# Patient Record
Sex: Male | Born: 1970 | Race: White | Hispanic: No | Marital: Married | Smoking: Never smoker
Health system: Southern US, Community
[De-identification: ages and names within clinical notes are randomized; demographics above are authoritative.]

## PROBLEM LIST (undated history)

## (undated) DIAGNOSIS — N419 Inflammatory disease of prostate, unspecified: Secondary | ICD-10-CM

## (undated) DIAGNOSIS — T7840XA Allergy, unspecified, initial encounter: Secondary | ICD-10-CM

## (undated) HISTORY — PX: CHOLECYSTECTOMY: SHX55

## (undated) HISTORY — DX: Inflammatory disease of prostate, unspecified: N41.9

## (undated) HISTORY — PX: KNEE ARTHROSCOPY: SUR90

## (undated) HISTORY — DX: Allergy, unspecified, initial encounter: T78.40XA

## (undated) HISTORY — PX: BREAST SURGERY: SHX581

---

## 2004-04-20 ENCOUNTER — Ambulatory Visit: Payer: Self-pay | Admitting: Physician Assistant

## 2004-04-22 ENCOUNTER — Ambulatory Visit: Payer: Self-pay | Admitting: Unknown Physician Specialty

## 2004-05-07 ENCOUNTER — Ambulatory Visit: Payer: Self-pay | Admitting: Psychiatry

## 2004-06-18 ENCOUNTER — Ambulatory Visit: Payer: Self-pay | Admitting: Psychiatry

## 2004-08-06 ENCOUNTER — Ambulatory Visit: Payer: Self-pay | Admitting: Psychiatry

## 2005-06-28 ENCOUNTER — Emergency Department: Payer: Self-pay | Admitting: Emergency Medicine

## 2006-07-11 ENCOUNTER — Ambulatory Visit: Payer: Self-pay | Admitting: Family Medicine

## 2007-07-04 ENCOUNTER — Ambulatory Visit: Payer: Self-pay | Admitting: Family Medicine

## 2007-07-05 ENCOUNTER — Ambulatory Visit: Payer: Self-pay | Admitting: Family Medicine

## 2008-04-18 ENCOUNTER — Ambulatory Visit: Payer: Self-pay | Admitting: Family Medicine

## 2008-10-29 ENCOUNTER — Ambulatory Visit: Payer: Self-pay | Admitting: Sports Medicine

## 2009-11-14 ENCOUNTER — Emergency Department: Payer: Self-pay | Admitting: Emergency Medicine

## 2009-11-19 ENCOUNTER — Ambulatory Visit: Payer: Self-pay | Admitting: General Practice

## 2010-02-20 ENCOUNTER — Ambulatory Visit: Payer: Self-pay | Admitting: Internal Medicine

## 2012-03-15 ENCOUNTER — Ambulatory Visit: Payer: Self-pay | Admitting: Emergency Medicine

## 2012-03-15 LAB — RAPID INFLUENZA A&B ANTIGENS

## 2012-03-17 ENCOUNTER — Ambulatory Visit: Payer: Self-pay | Admitting: Family Medicine

## 2012-03-17 LAB — HEPATIC FUNCTION PANEL A (ARMC)
Albumin: 3.5 g/dL (ref 3.4–5.0)
Alkaline Phosphatase: 71 U/L (ref 50–136)
Bilirubin, Direct: 0.1 mg/dL (ref 0.00–0.20)
SGOT(AST): 24 U/L (ref 15–37)
Total Protein: 8.1 g/dL (ref 6.4–8.2)

## 2012-03-17 LAB — CBC WITH DIFFERENTIAL/PLATELET
Basophil #: 0.3 10*3/uL — ABNORMAL HIGH (ref 0.0–0.1)
Eosinophil %: 0.2 %
HCT: 45.1 % (ref 40.0–52.0)
HGB: 15.6 g/dL (ref 13.0–18.0)
Lymphocyte %: 14.5 %
MCV: 87 fL (ref 80–100)
Monocyte #: 0.4 x10 3/mm (ref 0.2–1.0)
Monocyte %: 8.9 %
Neutrophil #: 2.9 10*3/uL (ref 1.4–6.5)
Platelet: 163 10*3/uL (ref 150–440)
RBC: 5.21 10*6/uL (ref 4.40–5.90)
RDW: 13.2 % (ref 11.5–14.5)
WBC: 4.1 10*3/uL (ref 3.8–10.6)

## 2013-08-18 ENCOUNTER — Ambulatory Visit: Payer: Self-pay

## 2013-08-18 LAB — RAPID INFLUENZA A&B ANTIGENS

## 2013-08-18 LAB — RAPID STREP-A WITH REFLX: Micro Text Report: NEGATIVE

## 2013-08-20 ENCOUNTER — Ambulatory Visit: Payer: Self-pay

## 2013-08-20 LAB — CBC WITH DIFFERENTIAL/PLATELET
Basophil #: 0 10*3/uL (ref 0.0–0.1)
Basophil %: 0.3 %
EOS ABS: 0 10*3/uL (ref 0.0–0.7)
EOS PCT: 0 %
HCT: 45.7 % (ref 40.0–52.0)
HGB: 15.9 g/dL (ref 13.0–18.0)
LYMPHS ABS: 0.5 10*3/uL — AB (ref 1.0–3.6)
Lymphocyte %: 8.2 %
MCH: 30.2 pg (ref 26.0–34.0)
MCHC: 34.8 g/dL (ref 32.0–36.0)
MCV: 87 fL (ref 80–100)
MONOS PCT: 7.9 %
Monocyte #: 0.4 x10 3/mm (ref 0.2–1.0)
NEUTROS ABS: 4.6 10*3/uL (ref 1.4–6.5)
Neutrophil %: 83.6 %
PLATELETS: 147 10*3/uL — AB (ref 150–440)
RBC: 5.25 10*6/uL (ref 4.40–5.90)
RDW: 12.8 % (ref 11.5–14.5)
WBC: 5.5 10*3/uL (ref 3.8–10.6)

## 2013-08-20 LAB — URINALYSIS, COMPLETE
GLUCOSE, UR: NEGATIVE mg/dL (ref 0–75)
Ketone: NEGATIVE
LEUKOCYTE ESTERASE: NEGATIVE
NITRITE: NEGATIVE
Ph: 6.5 (ref 4.5–8.0)
Protein: 30
SPECIFIC GRAVITY: 1.025 (ref 1.003–1.030)
Squamous Epithelial: NONE SEEN

## 2013-08-20 LAB — BASIC METABOLIC PANEL
ANION GAP: 10 (ref 7–16)
BUN: 11 mg/dL (ref 7–18)
CHLORIDE: 90 mmol/L — AB (ref 98–107)
CO2: 27 mmol/L (ref 21–32)
Calcium, Total: 8.5 mg/dL (ref 8.5–10.1)
Creatinine: 1.06 mg/dL (ref 0.60–1.30)
EGFR (African American): >60
EGFR (Non-African Amer.): >60
Glucose: 212 mg/dL — ABNORMAL HIGH (ref 65–99)
OSMOLALITY: 261 (ref 275–301)
Potassium: 3.8 mmol/L (ref 3.5–5.1)
Sodium: 127 mmol/L — ABNORMAL LOW (ref 136–145)

## 2013-08-20 LAB — MONONUCLEOSIS SCREEN: Mono Test: POSITIVE

## 2013-08-20 LAB — RAPID STREP-A WITH REFLX: MICRO TEXT REPORT: NEGATIVE

## 2013-08-21 LAB — BETA STREP CULTURE(ARMC)

## 2013-08-22 LAB — URINE CULTURE

## 2013-08-23 LAB — BETA STREP CULTURE(ARMC)

## 2014-03-14 ENCOUNTER — Ambulatory Visit: Payer: Self-pay | Admitting: Internal Medicine

## 2015-03-30 ENCOUNTER — Ambulatory Visit
Admission: EM | Admit: 2015-03-30 | Discharge: 2015-03-30 | Disposition: A | Discharge status: Home / Self Care | Payer: BLUE CROSS/BLUE SHIELD | Attending: Family Medicine | Admitting: Family Medicine

## 2015-03-30 ENCOUNTER — Encounter: Payer: Self-pay | Admitting: Gynecology

## 2015-03-30 DIAGNOSIS — J069 Acute upper respiratory infection, unspecified: Secondary | ICD-10-CM

## 2015-03-30 LAB — RAPID STREP SCREEN (MED CTR MEBANE ONLY): Streptococcus, Group A Screen (Direct): NEGATIVE

## 2015-03-30 NOTE — ED Provider Notes (Signed)
Patient presents today with symptoms of nasal drainage, sore throat, cough for the last 3 days. He denies any colored mucus, fever, severe headache. He states his sore throat is typically when he is lying down. He denies any sick contacts. He has not been taking any medications for symptoms. Denies any chest pain, shortness breath, nausea, vomiting, diarrhea, abdominal pain. He denies any smoking history.  ROS: Negative except mentioned above.  Vitals as per Epic. GENERAL: NAD HEENT: mild pharyngeal erythema, no exudate, no erythema of TMs, no sinus tenderness, no cervical LAD RESP: CTA B CARD: RRR NEURO: CN II-XII grossly intact   A/P: URI- rapid strep test was negative, Tylenol/Motrin when necessary, Claritin when necessary, Delsym when necessary, rest, hydration, seek medical attention if symptoms persist or worsen as discussed.   Paulina Fusi, MD 03/30/15 [phone]

## 2015-03-30 NOTE — ED Notes (Signed)
Patient c/o x couple days postal nasal drip / head ace / coughing / sore throat and bilateral ear clogged.

## 2015-04-01 LAB — CULTURE, GROUP A STREP (THRC)

## 2016-01-09 ENCOUNTER — Encounter: Payer: Self-pay | Admitting: Family Medicine

## 2016-01-09 ENCOUNTER — Encounter (INDEPENDENT_AMBULATORY_CARE_PROVIDER_SITE_OTHER): Payer: Self-pay

## 2016-01-09 ENCOUNTER — Ambulatory Visit (INDEPENDENT_AMBULATORY_CARE_PROVIDER_SITE_OTHER): Payer: Managed Care, Other (non HMO) | Admitting: Family Medicine

## 2016-01-09 VITALS — BP 130/80 | HR 70 | Ht 70.0 in | Wt 228.0 lb

## 2016-01-09 DIAGNOSIS — E119 Type 2 diabetes mellitus without complications: Secondary | ICD-10-CM | POA: Diagnosis not present

## 2016-01-09 DIAGNOSIS — B356 Tinea cruris: Secondary | ICD-10-CM

## 2016-01-09 DIAGNOSIS — R351 Nocturia: Secondary | ICD-10-CM | POA: Diagnosis not present

## 2016-01-09 LAB — POCT CBG (FASTING - GLUCOSE)-MANUAL ENTRY: GLUCOSE FASTING, POC: 255 mg/dL — AB (ref 70–99)

## 2016-01-09 MED ORDER — NYSTATIN 100000 UNIT/GM EX CREA: 1.0000 "application " | TOPICAL_CREAM | Freq: Two times a day (BID) | CUTANEOUS | 0 refills | Status: DC

## 2016-01-09 MED ORDER — FLUCONAZOLE 150 MG PO TABS: 150.0000 mg | ORAL_TABLET | Freq: Once | ORAL | 1 refills | Status: AC

## 2016-01-09 MED ORDER — METFORMIN HCL 500 MG PO TABS: 500.0000 mg | ORAL_TABLET | Freq: Two times a day (BID) | ORAL | 1 refills | Status: DC

## 2016-01-09 NOTE — Patient Instructions (Signed)

## 2016-01-09 NOTE — Progress Notes (Signed)
Name: Marcus Baldwin   MRN: [id number]    DOB: Jan 01, 1971   Date:01/09/2016       Progress Note  Subjective  Chief Complaint  Chief Complaint  Patient presents with  . Nocturia    wants to discuss the concern of diabetes- dad is diabetic, but controls with diet. Pt has had to get up approx 3 times per night as well as an "irritation" in the groin area    Diabetes  He presents for his initial diabetic visit. His disease course has been stable. There are no hypoglycemic associated symptoms. Pertinent negatives for hypoglycemia include no dizziness, headaches or nervousness/anxiousness. Pertinent negatives for diabetes include no blurred vision, no chest pain, no fatigue, no foot paresthesias, no foot ulcerations, no polydipsia, no polyphagia, no polyuria, no visual change, no weakness and no weight loss. (Nocturia) There are no hypoglycemic complications. Symptoms are stable. There are no diabetic complications. He is following a generally healthy diet. His breakfast blood glucose is taken between 7-8 am. His breakfast blood glucose range is generally >200 mg/dl. An ACE inhibitor/angiotensin II receptor blocker is not being taken. He does not see a podiatrist.Eye exam is not current.    No problem-specific Assessment & Plan notes found for this encounter.   Past Medical History:  Diagnosis Date  . Allergy     Past Surgical History:  Procedure Laterality Date  . BREAST SURGERY Left    lumpectomy  . CHOLECYSTECTOMY    . KNEE ARTHROSCOPY Left     Family History  Problem Relation Age of Onset  . Diabetes Father   . Cancer Maternal Uncle     Social History   Social History  . Marital status: Married    Spouse name: N/A  . Number of children: N/A  . Years of education: N/A   Occupational History  . Not on file.   Social History Main Topics  . Smoking status: Never Smoker  . Smokeless tobacco: Never Used  . Alcohol use Yes  . Drug use: No  . Sexual activity: Yes    Other Topics Concern  . Not on file   Social History Narrative  . No narrative on file    No Known Allergies   Review of Systems  Constitutional: Negative for chills, fatigue, fever, malaise/fatigue and weight loss.  HENT: Negative for ear discharge, ear pain and sore throat.   Eyes: Negative for blurred vision.  Respiratory: Negative for cough, sputum production, shortness of breath and wheezing.   Cardiovascular: Negative for chest pain, palpitations and leg swelling.  Gastrointestinal: Negative for abdominal pain, blood in stool, constipation, diarrhea, heartburn, melena and nausea.  Genitourinary: Negative for dysuria, frequency, hematuria and urgency.  Musculoskeletal: Negative for back pain, joint pain, myalgias and neck pain.  Skin: Negative for rash.  Neurological: Negative for dizziness, tingling, sensory change, focal weakness, weakness and headaches.  Endo/Heme/Allergies: Negative for environmental allergies, polydipsia and polyphagia. Does not bruise/bleed easily.  Psychiatric/Behavioral: Negative for depression and suicidal ideas. The patient is not nervous/anxious and does not have insomnia.      Objective  Vitals:   01/09/16 1114  BP: 130/80  Pulse: 70  Weight: 228 lb (103.4 kg)  Height: 5\' 10"  (1.778 m)    Physical Exam  Constitutional: He is oriented to person, place, and time and well-developed, well-nourished, and in no distress.  HENT:  Head: Normocephalic.  Right Ear: External ear normal.  Left Ear: External ear normal.  Nose: Nose normal.  Mouth/Throat: Oropharynx  is clear and moist.  Eyes: Conjunctivae and EOM are normal. Pupils are equal, round, and reactive to light. Right eye exhibits no discharge. Left eye exhibits no discharge. No scleral icterus.  Neck: Normal range of motion. Neck supple. No JVD present. No tracheal deviation present. No thyromegaly present.  Cardiovascular: Normal rate, regular rhythm, normal heart sounds and intact  distal pulses.  Exam reveals no gallop and no friction rub.   No murmur heard. Pulmonary/Chest: Breath sounds normal. No respiratory distress. He has no wheezes. He has no rales.  Abdominal: Soft. Bowel sounds are normal. He exhibits no mass. There is no hepatosplenomegaly. There is no tenderness. There is no rebound, no guarding and no CVA tenderness.  Musculoskeletal: Normal range of motion. He exhibits no edema or tenderness.  Lymphadenopathy:    He has no cervical adenopathy.  Neurological: He is alert and oriented to person, place, and time. He has normal sensation, normal strength, normal reflexes and intact cranial nerves. No cranial nerve deficit.  Skin: Skin is warm. No rash noted.  Psychiatric: Mood and affect normal.  Nursing note and vitals reviewed.     Assessment & Plan  Problem List Items Addressed This Visit    None    Visit Diagnoses    Type 2 diabetes mellitus without complication, unspecified long term insulin use status ([facility])    -  Primary   Relevant Medications   metFORMIN (GLUCOPHAGE) 500 MG tablet   Nocturia more than twice per night       Relevant Medications   metFORMIN (GLUCOPHAGE) 500 MG tablet   Other Relevant Orders   POCT CBG (Fasting - Glucose) (Completed)   Tinea cruris       Relevant Medications   nystatin cream (MYCOSTATIN)   fluconazole (DIFLUCAN) 150 MG tablet        Dr. Cora [facility]  01/09/16

## 2016-02-06 ENCOUNTER — Encounter: Payer: Self-pay | Admitting: Family Medicine

## 2016-02-06 ENCOUNTER — Ambulatory Visit (INDEPENDENT_AMBULATORY_CARE_PROVIDER_SITE_OTHER): Payer: Managed Care, Other (non HMO) | Admitting: Family Medicine

## 2016-02-06 VITALS — BP 130/88 | HR 72 | Ht 70.0 in | Wt 222.0 lb

## 2016-02-06 DIAGNOSIS — E119 Type 2 diabetes mellitus without complications: Secondary | ICD-10-CM

## 2016-02-06 DIAGNOSIS — R351 Nocturia: Secondary | ICD-10-CM

## 2016-02-06 MED ORDER — METFORMIN HCL 500 MG PO TABS: 500.0000 mg | ORAL_TABLET | Freq: Two times a day (BID) | ORAL | 1 refills | Status: DC

## 2016-02-06 NOTE — Progress Notes (Signed)
Name: Marcus Baldwin   MRN: [id number]    DOB: Dec 23, 1970   Date:02/06/2016       Progress Note  Subjective  Chief Complaint  Chief Complaint  Patient presents with  . Diabetes    follow up- started taking Metformin 500mg  BID x 4 weeks ago    Diabetes  He presents for his follow-up diabetic visit. He has type 2 diabetes mellitus. His disease course has been stable. Pertinent negatives for hypoglycemia include no confusion, dizziness, headaches, hunger, mood changes, nervousness/anxiousness, pallor, seizures, sleepiness, speech difficulty, sweats or tremors. Pertinent negatives for diabetes include no blurred vision, no chest pain, no fatigue, no foot paresthesias, no foot ulcerations, no polydipsia, no polyphagia, no polyuria, no visual change, no weakness and no weight loss. There are no hypoglycemic complications. Symptoms are stable. Risk factors for coronary artery disease include diabetes mellitus. Current diabetic treatment includes oral agent (monotherapy). He is compliant with treatment all of the time. He is following a generally healthy diet. He participates in exercise daily. His breakfast blood glucose is taken between 7-8 am. His breakfast blood glucose range is generally 130-140 mg/dl. An ACE inhibitor/angiotensin II receptor blocker is not being taken. He does not see a podiatrist.Eye exam is not current.    No problem-specific Assessment & Plan notes found for this encounter.   Past Medical History:  Diagnosis Date  . Allergy     Past Surgical History:  Procedure Laterality Date  . BREAST SURGERY Left    lumpectomy  . CHOLECYSTECTOMY    . KNEE ARTHROSCOPY Left     Family History  Problem Relation Age of Onset  . Diabetes Father   . Cancer Maternal Uncle     Social History   Social History  . Marital status: Married    Spouse name: N/A  . Number of children: N/A  . Years of education: N/A   Occupational History  . Not on file.   Social History  Main Topics  . Smoking status: Never Smoker  . Smokeless tobacco: Never Used  . Alcohol use Yes  . Drug use: No  . Sexual activity: Yes   Other Topics Concern  . Not on file   Social History Narrative  . No narrative on file    No Known Allergies   Review of Systems  Constitutional: Negative for chills, fatigue, fever, malaise/fatigue and weight loss.  HENT: Negative for ear discharge, ear pain and sore throat.   Eyes: Negative for blurred vision.  Respiratory: Negative for cough, sputum production, shortness of breath and wheezing.   Cardiovascular: Negative for chest pain, palpitations and leg swelling.  Gastrointestinal: Negative for abdominal pain, blood in stool, constipation, diarrhea, heartburn, melena and nausea.  Genitourinary: Negative for dysuria, frequency, hematuria and urgency.  Musculoskeletal: Negative for back pain, joint pain, myalgias and neck pain.  Skin: Negative for pallor and rash.  Neurological: Negative for dizziness, tingling, tremors, sensory change, focal weakness, seizures, speech difficulty, weakness and headaches.  Endo/Heme/Allergies: Negative for environmental allergies, polydipsia and polyphagia. Does not bruise/bleed easily.  Psychiatric/Behavioral: Negative for confusion, depression and suicidal ideas. The patient is not nervous/anxious and does not have insomnia.      Objective  Vitals:   02/06/16 0945  BP: 130/88  Pulse: 72  Weight: 222 lb (100.7 kg)  Height: 5\' 10"  (1.778 m)    Physical Exam  Constitutional: He is oriented to person, place, and time and well-developed, well-nourished, and in no distress.  HENT:  Head: Normocephalic.  Right Ear: External ear normal.  Left Ear: External ear normal.  Nose: Nose normal.  Mouth/Throat: Oropharynx is clear and moist.  Eyes: Conjunctivae and EOM are normal. Pupils are equal, round, and reactive to light. Right eye exhibits no discharge. Left eye exhibits no discharge. No scleral  icterus.  Neck: Normal range of motion. Neck supple. No JVD present. No tracheal deviation present. No thyromegaly present.  Cardiovascular: Normal rate, regular rhythm, normal heart sounds and intact distal pulses.  Exam reveals no gallop and no friction rub.   No murmur heard. Pulmonary/Chest: Breath sounds normal. No respiratory distress. He has no wheezes. He has no rales.  Abdominal: Soft. Bowel sounds are normal. He exhibits no mass. There is no hepatosplenomegaly. There is no tenderness. There is no rebound, no guarding and no CVA tenderness.  Musculoskeletal: Normal range of motion. He exhibits no edema or tenderness.  Lymphadenopathy:    He has no cervical adenopathy.  Neurological: He is alert and oriented to person, place, and time. He has normal sensation, normal strength and intact cranial nerves. No cranial nerve deficit.  Skin: Skin is warm. No rash noted.  Psychiatric: Mood and affect normal.  Nursing note and vitals reviewed.     Assessment & Plan  Problem List Items Addressed This Visit    None    Visit Diagnoses    Type 2 diabetes mellitus without complication, without long-term current use of insulin ([facility])    -  Primary   Relevant Medications   metFORMIN (GLUCOPHAGE) 500 MG tablet   Other Relevant Orders   Renal function panel   Hemoglobin A1c   Lipid Profile   Microalbumin / creatinine urine ratio   Nocturia more than twice per night       Relevant Medications   metFORMIN (GLUCOPHAGE) 500 MG tablet   Type 2 diabetes mellitus without complication, unspecified long term insulin use status ([facility])       Relevant Medications   metFORMIN (GLUCOPHAGE) 500 MG tablet     I spent 15 minutes with this patient, More than 50% of that time was spent in face to face education, counseling and care coordination.  Dr. [facility]  02/06/16

## 2016-02-07 LAB — RENAL FUNCTION PANEL
Albumin: 4.5 g/dL (ref 3.5–5.5)
BUN / CREAT RATIO: 16 (ref 9–20)
BUN: 15 mg/dL (ref 6–24)
CO2: 25 mmol/L (ref 18–29)
CREATININE: 0.93 mg/dL (ref 0.76–1.27)
Calcium: 9.7 mg/dL (ref 8.7–10.2)
Chloride: 96 mmol/L (ref 96–106)
GFR calc Af Amer: 113 mL/min/{1.73_m2} (ref >59)
GFR, EST NON AFRICAN AMERICAN: 97 mL/min/{1.73_m2} (ref >59)
Glucose: 103 mg/dL — ABNORMAL HIGH (ref 65–99)
PHOSPHORUS: 3 mg/dL (ref 2.5–4.5)
Potassium: 4.3 mmol/L (ref 3.5–5.2)
SODIUM: 139 mmol/L (ref 134–144)

## 2016-02-07 LAB — LIPID PANEL
CHOL/HDL RATIO: 6.1 ratio — AB (ref 0.0–5.0)
Cholesterol, Total: 202 mg/dL — ABNORMAL HIGH (ref 100–199)
HDL: 33 mg/dL — ABNORMAL LOW (ref >39)
LDL CALC: 144 mg/dL — AB (ref 0–99)
Triglycerides: 125 mg/dL (ref 0–149)
VLDL Cholesterol Cal: 25 mg/dL (ref 5–40)

## 2016-02-07 LAB — MICROALBUMIN / CREATININE URINE RATIO: CREATININE, UR: 121.3 mg/dL

## 2016-02-07 LAB — HEMOGLOBIN A1C
Est. average glucose Bld gHb Est-mCnc: 232 mg/dL
HEMOGLOBIN A1C: 9.7 % — AB (ref 4.8–5.6)

## 2016-02-09 ENCOUNTER — Other Ambulatory Visit: Payer: Self-pay

## 2016-02-09 DIAGNOSIS — E119 Type 2 diabetes mellitus without complications: Secondary | ICD-10-CM

## 2016-02-12 ENCOUNTER — Other Ambulatory Visit: Payer: Self-pay

## 2016-02-17 ENCOUNTER — Other Ambulatory Visit: Payer: Self-pay | Admitting: Family Medicine

## 2016-03-15 ENCOUNTER — Ambulatory Visit: Payer: BLUE CROSS/BLUE SHIELD | Admitting: *Deleted

## 2016-03-31 ENCOUNTER — Ambulatory Visit: Payer: BLUE CROSS/BLUE SHIELD | Admitting: *Deleted

## 2016-08-05 ENCOUNTER — Ambulatory Visit: Payer: Managed Care, Other (non HMO) | Admitting: Family Medicine

## 2017-01-28 DIAGNOSIS — E1169 Type 2 diabetes mellitus with other specified complication: Secondary | ICD-10-CM | POA: Insufficient documentation

## 2017-01-28 DIAGNOSIS — E785 Hyperlipidemia, unspecified: Secondary | ICD-10-CM | POA: Insufficient documentation

## 2017-03-03 ENCOUNTER — Encounter: Payer: Self-pay | Admitting: Family Medicine

## 2017-03-03 ENCOUNTER — Ambulatory Visit: Payer: Managed Care, Other (non HMO) | Admitting: Family Medicine

## 2017-03-03 VITALS — BP 138/80 | HR 76 | Ht 70.0 in | Wt 225.0 lb

## 2017-03-03 DIAGNOSIS — G5601 Carpal tunnel syndrome, right upper limb: Secondary | ICD-10-CM

## 2017-03-03 MED ORDER — MELOXICAM 15 MG PO TABS: 15.0000 mg | ORAL_TABLET | Freq: Every day | ORAL | 1 refills | Status: DC

## 2017-03-03 NOTE — Progress Notes (Signed)
Name: Marcus Baldwin   MRN: [id number]    DOB: 1970/07/27   Date:03/03/2017       Progress Note  Subjective  Chief Complaint  Chief Complaint  Patient presents with  . Hand Pain    R) hand pain- index finger hurts middle finger is tight- gets worse with the cold weather    Hand Pain   The incident occurred more than 1 week ago (since August). There was no injury mechanism. The quality of the pain is described as aching. The pain does not radiate. The pain is at a severity of 6/10. The pain is moderate. The pain has been worsening since the incident. Pertinent negatives include no chest pain, muscle weakness, numbness or tingling. The symptoms are aggravated by movement. He has tried NSAIDs and acetaminophen for the symptoms. The treatment provided moderate relief.    No problem-specific Assessment & Plan notes found for this encounter.   Past Medical History:  Diagnosis Date  . Allergy     Past Surgical History:  Procedure Laterality Date  . BREAST SURGERY Left    lumpectomy  . CHOLECYSTECTOMY    . KNEE ARTHROSCOPY Left     Family History  Problem Relation Age of Onset  . Diabetes Father   . Cancer Maternal Uncle     Social History   Socioeconomic History  . Marital status: Married    Spouse name: Not on file  . Number of children: Not on file  . Years of education: Not on file  . Highest education level: Not on file  Social Needs  . Financial resource strain: Not on file  . Food insecurity - worry: Not on file  . Food insecurity - inability: Not on file  . Transportation needs - medical: Not on file  . Transportation needs - non-medical: Not on file  Occupational History  . Not on file  Tobacco Use  . Smoking status: Never Smoker  . Smokeless tobacco: Never Used  Substance and Sexual Activity  . Alcohol use: Yes  . Drug use: No  . Sexual activity: Yes  Other Topics Concern  . Not on file  Social History Narrative  . Not on file    No Known  Allergies  Outpatient Medications Prior to Visit  Medication Sig Dispense Refill  . Dulaglutide (TRULICITY) 1.5 ZC/5.8IF SOPN Inject 1.5 mg into the skin once a week. Dr Honor Junes    . metFORMIN (GLUCOPHAGE) 500 MG tablet Take 1 tablet (500 mg total) by mouth 2 (two) times daily with a meal. 180 tablet 1  . nystatin cream (MYCOSTATIN) Apply 1 application topically 2 (two) times daily. (Patient not taking: Reported on 02/06/2016) 30 g 0   No facility-administered medications prior to visit.     Review of Systems  Constitutional: Negative for chills, fever, malaise/fatigue and weight loss.  HENT: Negative for ear discharge, ear pain and sore throat.   Eyes: Negative for blurred vision.  Respiratory: Negative for cough, sputum production, shortness of breath and wheezing.   Cardiovascular: Negative for chest pain, palpitations and leg swelling.  Gastrointestinal: Negative for abdominal pain, blood in stool, constipation, diarrhea, heartburn, melena and nausea.  Genitourinary: Negative for dysuria, frequency, hematuria and urgency.  Musculoskeletal: Negative for back pain, joint pain, myalgias and neck pain.  Skin: Negative for rash.  Neurological: Negative for dizziness, tingling, sensory change, focal weakness, numbness and headaches.  Endo/Heme/Allergies: Negative for environmental allergies and polydipsia. Does not bruise/bleed easily.  Psychiatric/Behavioral: Negative for depression and  suicidal ideas. The patient is not nervous/anxious and does not have insomnia.      Objective  Vitals:   03/03/17 1113  BP: 138/80  Pulse: 76  Weight: 225 lb (102.1 kg)  Height: 5\' 10"  (1.778 m)    Physical Exam  Constitutional: He is oriented to person, place, and time and well-developed, well-nourished, and in no distress.  HENT:  Head: Normocephalic.  Right Ear: External ear normal.  Left Ear: External ear normal.  Nose: Nose normal.  Mouth/Throat: Oropharynx is clear and moist.  Eyes:  Conjunctivae and EOM are normal. Pupils are equal, round, and reactive to light. Right eye exhibits no discharge. Left eye exhibits no discharge. No scleral icterus.  Neck: Normal range of motion. Neck supple. No JVD present. No tracheal deviation present. No thyromegaly present.  Cardiovascular: Normal rate, regular rhythm, normal heart sounds and intact distal pulses. Exam reveals no gallop and no friction rub.  No murmur heard. Pulmonary/Chest: Breath sounds normal. No respiratory distress. He has no wheezes. He has no rales.  Abdominal: Soft. Bowel sounds are normal. He exhibits no mass. There is no hepatosplenomegaly. There is no tenderness. There is no rebound, no guarding and no CVA tenderness.  Musculoskeletal: Normal range of motion. He exhibits no edema or tenderness.       Right wrist: He exhibits normal range of motion and no tenderness.       Right hand: He exhibits normal range of motion, no tenderness, no bony tenderness and no deformity. Normal sensation noted. Normal strength noted.  Lymphadenopathy:    He has no cervical adenopathy.  Neurological: He is alert and oriented to person, place, and time. He has normal sensation, normal strength, normal reflexes and intact cranial nerves. No cranial nerve deficit.  Positive tinel/negative phelen  Skin: Skin is warm. No rash noted.  Psychiatric: Mood and affect normal.  Nursing note and vitals reviewed.     Assessment & Plan  Problem List Items Addressed This Visit    None    Visit Diagnoses    Carpal tunnel syndrome of right wrist    -  Primary   Relevant Medications   meloxicam (MOBIC) 15 MG tablet   Other Relevant Orders   Ambulatory referral to Orthopedic Surgery      Meds ordered this encounter  Medications  . meloxicam (MOBIC) 15 MG tablet    Sig: Take 1 tablet (15 mg total) by mouth daily.    Dispense:  30 tablet    Refill:  1      Dr. Laretta [facility]  03/03/17

## 2017-03-03 NOTE — Patient Instructions (Signed)
Carpal Tunnel Syndrome Carpal tunnel syndrome is a condition that causes pain in your hand and arm. The carpal tunnel is a narrow area located on the palm side of your wrist. Repeated wrist motion or certain diseases may cause swelling within the tunnel. This swelling pinches the main nerve in the wrist (median nerve). What are the causes? This condition may be caused by:  Repeated wrist motions.  Wrist injuries.  Arthritis.  A cyst or tumor in the carpal tunnel.  Fluid buildup during pregnancy.  Sometimes the cause of this condition is not known. What increases the risk? This condition is more likely to develop in:  People who have jobs that cause them to repeatedly move their wrists in the same motion, such as butchers and cashiers.  Women.  People with certain conditions, such as: ? Diabetes. ? Obesity. ? An underactive thyroid (hypothyroidism). ? Kidney failure.  What are the signs or symptoms? Symptoms of this condition include:  A tingling feeling in your fingers, especially in your thumb, index, and middle fingers.  Tingling or numbness in your hand.  An aching feeling in your entire arm, especially when your wrist and elbow are bent for long periods of time.  Wrist pain that goes up your arm to your shoulder.  Pain that goes down into your palm or fingers.  A weak feeling in your hands. You may have trouble grabbing and holding items.  Your symptoms may feel worse during the night. How is this diagnosed? This condition is diagnosed with a medical history and physical exam. You may also have tests, including:  An electromyogram (EMG). This test measures electrical signals sent by your nerves into the muscles.  X-rays.  How is this treated? Treatment for this condition includes:  Lifestyle changes. It is important to stop doing or modify the activity that caused your condition.  Physical or occupational therapy.  Medicines for pain and inflammation.  This may include medicine that is injected into your wrist.  A wrist splint.  Surgery.  Follow these instructions at home: If you have a splint:  Wear it as told by your health care provider. Remove it only as told by your health care provider.  Loosen the splint if your fingers become numb and tingle, or if they turn cold and blue.  Keep the splint clean and dry. General instructions  Take over-the-counter and prescription medicines only as told by your health care provider.  Rest your wrist from any activity that may be causing your pain. If your condition is work related, talk to your employer about changes that can be made, such as getting a wrist pad to use while typing.  If directed, apply ice to the painful area: ? Put ice in a plastic bag. ? Place a towel between your skin and the bag. ? Leave the ice on for 20 minutes, 2-3 times per day.  Keep all follow-up visits as told by your health care provider. This is important.  Do any exercises as told by your health care provider, physical therapist, or occupational therapist. Contact a health care provider if:  You have new symptoms.  Your pain is not controlled with medicines.  Your symptoms get worse. This information is not intended to replace advice given to you by your health care provider. Make sure you discuss any questions you have with your health care provider. Document Released: 03/12/2000 Document Revised: 07/24/2015 Document Reviewed: 07/31/2014 [employer] Interactive Patient Education  2017 [employer]  

## 2017-03-11 DIAGNOSIS — E66811 Obesity, class 1: Secondary | ICD-10-CM | POA: Insufficient documentation

## 2017-03-11 DIAGNOSIS — E669 Obesity, unspecified: Secondary | ICD-10-CM | POA: Insufficient documentation

## 2017-04-26 ENCOUNTER — Other Ambulatory Visit: Payer: Self-pay | Admitting: Family Medicine

## 2017-04-26 DIAGNOSIS — G5601 Carpal tunnel syndrome, right upper limb: Secondary | ICD-10-CM

## 2017-07-27 ENCOUNTER — Encounter: Payer: Self-pay | Admitting: Family Medicine

## 2017-07-27 ENCOUNTER — Ambulatory Visit: Payer: Managed Care, Other (non HMO) | Admitting: Family Medicine

## 2017-07-27 VITALS — BP 120/80 | HR 80 | Ht 70.0 in | Wt 220.0 lb

## 2017-07-27 DIAGNOSIS — M5412 Radiculopathy, cervical region: Secondary | ICD-10-CM

## 2017-07-27 DIAGNOSIS — M5090 Cervical disc disorder, unspecified, unspecified cervical region: Secondary | ICD-10-CM | POA: Diagnosis not present

## 2017-07-27 DIAGNOSIS — E119 Type 2 diabetes mellitus without complications: Secondary | ICD-10-CM | POA: Diagnosis not present

## 2017-07-27 MED ORDER — PREDNISONE 10 MG PO TABS
10.0000 mg | ORAL_TABLET | Freq: Every day | ORAL | 0 refills | Status: DC
Start: 2017-07-27 — End: 2018-03-30

## 2017-07-27 MED ORDER — MELOXICAM 15 MG PO TABS: 15.0000 mg | ORAL_TABLET | Freq: Every day | ORAL | 6 refills | Status: DC

## 2017-07-27 NOTE — Progress Notes (Signed)
Name: Marcus Baldwin   MRN: [id number]    DOB: Jan 15, 1971   Date:07/27/2017       Progress Note  Subjective  Chief Complaint  Chief Complaint  Patient presents with  . Arm Pain    a burning sensation around the R) side of back and on top of forearm. 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 This is a new problem. The current episode started 1 to 4 weeks ago (4 weeks). There has been no history of extremity trauma. The problem occurs daily. The problem has been waxing and waning. The quality of the pain is described as burning. The pain is at a severity of 4/10. The pain is moderate. Pertinent negatives include no fever, inability to bear weight, itching, joint locking, joint swelling, limited range of motion, numbness, stiffness or tingling. The symptoms are aggravated by activity. The treatment provided moderate relief. There is no history of diabetes, gout, osteoarthritis or rheumatoid arthritis.  Arm Pain   There was no injury mechanism. The quality of the pain is described as aching. The pain radiates to the right arm. The pain is at a severity of 3/10. The pain is mild. Pertinent negatives include no chest pain, muscle weakness, numbness or tingling. The treatment provided moderate relief.    No problem-specific Assessment & Plan notes found for this encounter.   Past Medical History:  Diagnosis Date  . Allergy     Past Surgical History:  Procedure Laterality Date  . BREAST SURGERY Left    lumpectomy  . CHOLECYSTECTOMY    . KNEE ARTHROSCOPY Left     Family History  Problem Relation Age of Onset  . Diabetes Father   . Cancer Maternal Uncle     Social History   Socioeconomic History  . Marital status: Married    Spouse name: Not on file  . Number of children: Not on file  . Years of education: Not on file  . Highest education level: Not on file  Occupational History  . Not on file  Social  Needs  . Financial resource strain: Not on file  . Food insecurity:    Worry: Not on file    Inability: Not on file  . Transportation needs:    Medical: Not on file    Non-medical: Not on file  Tobacco Use  . Smoking status: Never Smoker  . Smokeless tobacco: Never Used  Substance and Sexual Activity  . Alcohol use: Yes  . Drug use: No  . Sexual activity: Yes  Lifestyle  . Physical activity:    Days per week: Not on file    Minutes per session: Not on file  . Stress: Not on file  Relationships  . Social connections:    Talks on phone: Not on file    Gets together: Not on file    Attends religious service: Not on file    Active member of club or organization: Not on file    Attends meetings of clubs or organizations: Not on file    Relationship status: Not on file  . Intimate partner violence:    Fear of current or ex partner: Not on file    Emotionally abused: Not on file    Physically abused: Not on file    Forced sexual activity: Not on file  Other Topics Concern  . Not on file  Social History Narrative  . Not on file  No Known Allergies  Outpatient Medications Prior to Visit  Medication Sig Dispense Refill  . Dulaglutide (TRULICITY) 1.5 PP/2.9JJ SOPN Inject 1.5 mg into the skin once a week. Dr Honor Junes    . metFORMIN (GLUCOPHAGE) 500 MG tablet Take 1 tablet (500 mg total) by mouth 2 (two) times daily with a meal. 180 tablet 1  . meloxicam (MOBIC) 15 MG tablet TAKE 1 TABLET BY MOUTH EVERY DAY 30 tablet 0   No facility-administered medications prior to visit.     Review of Systems  Constitutional: Negative for chills, fever, malaise/fatigue and weight loss.  HENT: Negative for ear discharge, ear pain and sore throat.   Eyes: Negative for blurred vision.  Respiratory: Negative for cough, sputum production, shortness of breath and wheezing.   Cardiovascular: Negative for chest pain, palpitations and leg swelling.  Gastrointestinal: Negative for abdominal  pain, blood in stool, constipation, diarrhea, heartburn, melena and nausea.  Genitourinary: Negative for dysuria, frequency, hematuria and urgency.  Musculoskeletal: Negative for back pain, gout, joint pain, myalgias, neck pain and stiffness.  Skin: Negative for itching and rash.  Neurological: Negative for dizziness, tingling, sensory change, focal weakness, numbness and headaches.  Endo/Heme/Allergies: Negative for environmental allergies and polydipsia. Does not bruise/bleed easily.  Psychiatric/Behavioral: Negative for depression and suicidal ideas. The patient is not nervous/anxious and does not have insomnia.      Objective  Vitals:   07/27/17 0806  BP: 120/80  Pulse: 80  Weight: 220 lb (99.8 kg)  Height: 5\' 10"  (1.778 m)    Physical Exam  Constitutional: He is oriented to person, place, and time.  HENT:  Head: Normocephalic.  Right Ear: External ear normal.  Left Ear: External ear normal.  Nose: Nose normal.  Mouth/Throat: Oropharynx is clear and moist.  Eyes: Pupils are equal, round, and reactive to light. Conjunctivae and EOM are normal. Right eye exhibits no discharge. Left eye exhibits no discharge. No scleral icterus.  Neck: Normal range of motion. Neck supple. No JVD present. No tracheal deviation present. No thyromegaly present.  Cardiovascular: Normal rate, regular rhythm, normal heart sounds and intact distal pulses. Exam reveals no gallop and no friction rub.  No murmur heard. Pulmonary/Chest: Breath sounds normal. No respiratory distress. He has no wheezes. He has no rales.  Abdominal: Soft. Bowel sounds are normal. He exhibits no mass. There is no hepatosplenomegaly. There is no tenderness. There is no rebound, no guarding and no CVA tenderness.  Musculoskeletal: Normal range of motion. He exhibits no edema or tenderness.       Cervical back: He exhibits spasm. He exhibits normal range of motion, no tenderness and no bony tenderness.  Lymphadenopathy:    He has  no cervical adenopathy.  Neurological: He is alert and oriented to person, place, and time. He has normal strength and normal reflexes. No cranial nerve deficit or sensory deficit.  Reflex Scores:      Tricep reflexes are 2+ on the right side and 2+ on the left side.      Bicep reflexes are 2+ on the right side and 2+ on the left side.      Brachioradialis reflexes are 2+ on the right side and 2+ on the left side. Skin: Skin is warm. No rash noted.  Nursing note and vitals reviewed.     Assessment & Plan  Problem List Items Addressed This Visit      Endocrine   Type 2 diabetes mellitus without complication, without long-term current use of insulin ([place])  Other Visit Diagnoses    Cervical disc disease    -  Primary   Relevant Medications   meloxicam (MOBIC) 15 MG tablet   predniSONE (DELTASONE) 10 MG tablet   Cervical radiculopathy       Relevant Medications   meloxicam (MOBIC) 15 MG tablet   predniSONE (DELTASONE) 10 MG tablet      Meds ordered this encounter  Medications  . meloxicam (MOBIC) 15 MG tablet    Sig: Take 1 tablet (15 mg total) by mouth daily.    Dispense:  30 tablet    Refill:  6  . predniSONE (DELTASONE) 10 MG tablet    Sig: Take 1 tablet (10 mg total) by mouth daily with breakfast.    Dispense:  30 tablet    Refill:  0      Dr. [facility]  07/27/17

## 2017-08-04 ENCOUNTER — Ambulatory Visit
Admission: RE | Admit: 2017-08-04 | Discharge: 2017-08-04 | Disposition: A | Discharge status: Home / Self Care | Payer: Managed Care, Other (non HMO) | Source: Ambulatory Visit | Attending: Family Medicine | Admitting: Family Medicine

## 2017-08-04 ENCOUNTER — Other Ambulatory Visit: Payer: Managed Care, Other (non HMO)

## 2017-08-04 ENCOUNTER — Other Ambulatory Visit: Payer: Self-pay

## 2017-08-04 DIAGNOSIS — M4802 Spinal stenosis, cervical region: Secondary | ICD-10-CM | POA: Diagnosis not present

## 2017-08-04 DIAGNOSIS — M47812 Spondylosis without myelopathy or radiculopathy, cervical region: Secondary | ICD-10-CM | POA: Insufficient documentation

## 2017-08-04 DIAGNOSIS — M542 Cervicalgia: Secondary | ICD-10-CM | POA: Insufficient documentation

## 2017-08-05 ENCOUNTER — Ambulatory Visit: Payer: Managed Care, Other (non HMO) | Admitting: Family Medicine

## 2017-08-05 ENCOUNTER — Encounter: Payer: Self-pay | Admitting: Family Medicine

## 2017-08-05 VITALS — BP 130/80 | HR 72 | Ht 70.0 in | Wt 230.0 lb

## 2017-08-05 DIAGNOSIS — M5412 Radiculopathy, cervical region: Secondary | ICD-10-CM | POA: Diagnosis not present

## 2017-08-05 NOTE — Progress Notes (Signed)
Name: Marcus Baldwin   MRN: [id number]    DOB: 04-30-1970   Date:08/05/2017       Progress Note  Subjective  Chief Complaint  Chief Complaint  Patient presents with  . Neck Pain    follow up- med not helping. Shoulder blade is a burning sensation on R) side and radiating down arm    Neck Pain   This is a new problem. The current episode started 1 to 4 weeks ago (3 weeks). The problem occurs constantly. The problem has been gradually improving. Associated with: fell out of jeep. The pain is present in the right side. The quality of the pain is described as burning. The pain is at a severity of 5/10. The pain is moderate. The symptoms are aggravated by twisting and bending. Pertinent negatives include no chest pain, fever, headaches, paresis, tingling, weakness or weight loss. Associated symptoms comments: Shoulder /arm pain. He has tried NSAIDs and muscle relaxants for the symptoms.    No problem-specific Assessment & Plan notes found for this encounter.   Past Medical History:  Diagnosis Date  . Allergy     Past Surgical History:  Procedure Laterality Date  . BREAST SURGERY Left    lumpectomy  . CHOLECYSTECTOMY    . KNEE ARTHROSCOPY Left     Family History  Problem Relation Age of Onset  . Diabetes Father   . Cancer Maternal Uncle     Social History   Socioeconomic History  . Marital status: Married    Spouse name: Not on file  . Number of children: Not on file  . Years of education: Not on file  . Highest education level: Not on file  Occupational History  . Not on file  Social Needs  . Financial resource strain: Not on file  . Food insecurity:    Worry: Not on file    Inability: Not on file  . Transportation needs:    Medical: Not on file    Non-medical: Not on file  Tobacco Use  . Smoking status: Never Smoker  . Smokeless tobacco: Never Used  Substance and Sexual Activity  . Alcohol use: Yes  . Drug use: No  . Sexual activity: Yes  Lifestyle   . Physical activity:    Days per week: Not on file    Minutes per session: Not on file  . Stress: Not on file  Relationships  . Social connections:    Talks on phone: Not on file    Gets together: Not on file    Attends religious service: Not on file    Active member of club or organization: Not on file    Attends meetings of clubs or organizations: Not on file    Relationship status: Not on file  . Intimate partner violence:    Fear of current or ex partner: Not on file    Emotionally abused: Not on file    Physically abused: Not on file    Forced sexual activity: Not on file  Other Topics Concern  . Not on file  Social History Narrative  . Not on file    No Known Allergies  Outpatient Medications Prior to Visit  Medication Sig Dispense Refill  . Dulaglutide (TRULICITY) 1.5 NU/2.7OZ SOPN Inject 1.5 mg into the skin once a week. Dr Honor Junes    . meloxicam (MOBIC) 15 MG tablet Take 1 tablet (15 mg total) by mouth daily. 30 tablet 6  . metFORMIN (GLUCOPHAGE) 500 MG tablet Take 1  tablet (500 mg total) by mouth 2 (two) times daily with a meal. 180 tablet 1  . predniSONE (DELTASONE) 10 MG tablet Take 1 tablet (10 mg total) by mouth daily with breakfast. 30 tablet 0   No facility-administered medications prior to visit.     Review of Systems  Constitutional: Negative for chills, fever, malaise/fatigue and weight loss.  HENT: Negative for ear discharge, ear pain and sore throat.   Eyes: Negative for blurred vision.  Respiratory: Negative for cough, sputum production, shortness of breath and wheezing.   Cardiovascular: Negative for chest pain, palpitations and leg swelling.  Gastrointestinal: Negative for abdominal pain, blood in stool, constipation, diarrhea, heartburn, melena and nausea.  Genitourinary: Negative for dysuria, frequency, hematuria and urgency.  Musculoskeletal: Positive for neck pain. Negative for back pain, joint pain and myalgias.  Skin: Negative for rash.   Neurological: Negative for dizziness, tingling, sensory change, focal weakness, weakness and headaches.  Endo/Heme/Allergies: Negative for environmental allergies and polydipsia. Does not bruise/bleed easily.  Psychiatric/Behavioral: Negative for depression and suicidal ideas. The patient is not nervous/anxious and does not have insomnia.      Objective  Vitals:   08/05/17 0932  BP: 130/80  Pulse: 72  Weight: 230 lb (104.3 kg)  Height: 5\' 10"  (1.778 m)    Physical Exam  Constitutional: He is oriented to person, place, and time.  HENT:  Head: Normocephalic.  Right Ear: External ear normal.  Left Ear: External ear normal.  Nose: Nose normal.  Mouth/Throat: Oropharynx is clear and moist.  Eyes: Pupils are equal, round, and reactive to light. Conjunctivae and EOM are normal. Right eye exhibits no discharge. Left eye exhibits no discharge. No scleral icterus.  Neck: Normal range of motion. Neck supple. No JVD present. No tracheal deviation present. No thyromegaly present.  Cardiovascular: Normal rate, regular rhythm, normal heart sounds and intact distal pulses. Exam reveals no gallop and no friction rub.  No murmur heard. Pulmonary/Chest: Breath sounds normal. No respiratory distress. He has no wheezes. He has no rales.  Abdominal: Soft. Bowel sounds are normal. He exhibits no mass. There is no hepatosplenomegaly. There is no tenderness. There is no rebound, no guarding and no CVA tenderness.  Musculoskeletal: Normal range of motion. He exhibits no edema or tenderness.  Lymphadenopathy:    He has no cervical adenopathy.  Neurological: He is alert and oriented to person, place, and time. He has normal strength and normal reflexes. No cranial nerve deficit.  Skin: Skin is warm. No rash noted.  Nursing note and vitals reviewed.     Assessment & Plan  Problem List Items Addressed This Visit    None    Visit Diagnoses    Cervical radiculopathy    -  Primary   Relevant Orders    Ambulatory referral to Orthopedic Surgery      No orders of the defined types were placed in this encounter.     Dr. [facility]  08/05/17

## 2017-08-05 NOTE — Patient Instructions (Signed)

## 2017-08-08 DIAGNOSIS — M5030 Other cervical disc degeneration, unspecified cervical region: Secondary | ICD-10-CM | POA: Insufficient documentation

## 2017-08-23 ENCOUNTER — Other Ambulatory Visit: Payer: Self-pay | Admitting: Family Medicine

## 2017-08-23 DIAGNOSIS — M5412 Radiculopathy, cervical region: Secondary | ICD-10-CM

## 2017-08-23 DIAGNOSIS — M5090 Cervical disc disorder, unspecified, unspecified cervical region: Secondary | ICD-10-CM

## 2017-09-05 ENCOUNTER — Other Ambulatory Visit: Payer: Self-pay

## 2017-09-05 MED ORDER — FLUCONAZOLE 150 MG PO TABS: 150.0000 mg | ORAL_TABLET | Freq: Once | ORAL | 0 refills | Status: AC

## 2017-09-05 MED ORDER — NYSTATIN 100000 UNIT/GM EX CREA: 1.0000 "application " | TOPICAL_CREAM | Freq: Two times a day (BID) | CUTANEOUS | 0 refills | Status: DC

## 2017-09-05 NOTE — Progress Notes (Unsigned)
Sent in diflucan and nystatin

## 2017-11-29 DIAGNOSIS — E1165 Type 2 diabetes mellitus with hyperglycemia: Secondary | ICD-10-CM | POA: Diagnosis not present

## 2017-11-29 DIAGNOSIS — E113293 Type 2 diabetes mellitus with mild nonproliferative diabetic retinopathy without macular edema, bilateral: Secondary | ICD-10-CM | POA: Diagnosis not present

## 2018-01-02 DIAGNOSIS — H1032 Unspecified acute conjunctivitis, left eye: Secondary | ICD-10-CM | POA: Diagnosis not present

## 2018-01-23 ENCOUNTER — Ambulatory Visit (INDEPENDENT_AMBULATORY_CARE_PROVIDER_SITE_OTHER): Payer: BLUE CROSS/BLUE SHIELD

## 2018-01-23 DIAGNOSIS — Z23 Encounter for immunization: Secondary | ICD-10-CM

## 2018-02-17 ENCOUNTER — Other Ambulatory Visit: Payer: Self-pay | Admitting: Family Medicine

## 2018-02-17 DIAGNOSIS — M5090 Cervical disc disorder, unspecified, unspecified cervical region: Secondary | ICD-10-CM

## 2018-02-17 DIAGNOSIS — M5412 Radiculopathy, cervical region: Secondary | ICD-10-CM

## 2018-03-30 ENCOUNTER — Ambulatory Visit (INDEPENDENT_AMBULATORY_CARE_PROVIDER_SITE_OTHER): Payer: BLUE CROSS/BLUE SHIELD | Admitting: Family Medicine

## 2018-03-30 ENCOUNTER — Encounter: Payer: Self-pay | Admitting: Family Medicine

## 2018-03-30 VITALS — BP 138/80 | HR 80 | Ht 70.0 in | Wt 232.0 lb

## 2018-03-30 DIAGNOSIS — R05 Cough: Secondary | ICD-10-CM | POA: Diagnosis not present

## 2018-03-30 DIAGNOSIS — J0100 Acute maxillary sinusitis, unspecified: Secondary | ICD-10-CM | POA: Diagnosis not present

## 2018-03-30 DIAGNOSIS — R059 Cough, unspecified: Secondary | ICD-10-CM

## 2018-03-30 MED ORDER — GUAIFENESIN-CODEINE 100-10 MG/5ML PO SYRP: 5.0000 mL | ORAL_SOLUTION | Freq: Three times a day (TID) | ORAL | 0 refills | Status: DC | PRN

## 2018-03-30 MED ORDER — AZITHROMYCIN 250 MG PO TABS: ORAL_TABLET | ORAL | 0 refills | Status: DC

## 2018-03-30 NOTE — Progress Notes (Signed)
Date:  03/30/2018   Name:  Marcus Baldwin [facility]   DOB:  [birth year]   MRN:  [id number]   Chief Complaint: Sinusitis (dry cough, no production, headache- going out of town)  Sinusitis  This is a chronic problem. The current episode started in the past 7 days. The problem has been gradually worsening since onset. The maximum temperature recorded prior to his arrival was 100.4 - 100.9 F. The fever has been present for 1 to 2 days. The pain is moderate. Associated symptoms include congestion, coughing and sinus pressure. Pertinent negatives include no chills, diaphoresis, ear pain, headaches, hoarse voice, neck pain, shortness of breath, sneezing, sore throat or swollen glands. Past treatments include oral decongestants and acetaminophen. The treatment provided moderate relief.    Review of Systems  Constitutional: Negative for chills and diaphoresis.  HENT: Positive for congestion and sinus pressure. Negative for ear pain, hoarse voice, sneezing and sore throat.   Respiratory: Positive for cough. Negative for shortness of breath.   Musculoskeletal: Negative for neck pain.  Neurological: Negative for headaches.    Patient Active Problem List   Diagnosis Date Noted  . Type 2 diabetes mellitus without complication, without long-term current use of insulin ([place]) 07/27/2017    No Known Allergies  Past Surgical History:  Procedure Laterality Date  . BREAST SURGERY Left    lumpectomy  . CHOLECYSTECTOMY    . KNEE ARTHROSCOPY Left     Social History   Tobacco Use  . Smoking status: Never Smoker  . Smokeless tobacco: Never Used  Substance Use Topics  . Alcohol use: Yes  . Drug use: No     Medication list has been reviewed and updated.  Current Meds  Medication Sig  . Dulaglutide (TRULICITY) 1.5 BD/5.3GD SOPN Inject 1.5 mg into the skin once a week. Dr Honor Junes  . metFORMIN (GLUCOPHAGE) 500 MG tablet Take 1 tablet (500 mg total) by mouth 2 (two) times daily with a meal.  .  olmesartan (BENICAR) 20 MG tablet Take 1 tablet by mouth daily.    PHQ 2/9 Scores 03/03/2017 03/03/2017  PHQ - 2 Score 0 0  PHQ- 9 Score 0 -    Physical Exam Vitals signs and nursing note reviewed.  HENT:     Head: Normocephalic.     Right Ear: External ear normal.     Left Ear: External ear normal.     Nose: Nose normal.  Eyes:     General: No scleral icterus.       Right eye: No discharge.        Left eye: No discharge.     Conjunctiva/sclera: Conjunctivae normal.     Pupils: Pupils are equal, round, and reactive to light.  Neck:     Musculoskeletal: Normal range of motion and neck supple.     Thyroid: No thyromegaly.     Vascular: No JVD.     Trachea: No tracheal deviation.  Cardiovascular:     Rate and Rhythm: Normal rate and regular rhythm.     Heart sounds: Normal heart sounds. No murmur. No friction rub. No gallop.   Pulmonary:     Effort: No respiratory distress.     Breath sounds: Normal breath sounds. No wheezing or rales.  Abdominal:     General: Bowel sounds are normal.     Palpations: Abdomen is soft. There is no mass.     Tenderness: There is no abdominal tenderness. There is no guarding or rebound.  Musculoskeletal: Normal  range of motion.        General: No tenderness.  Lymphadenopathy:     Cervical: No cervical adenopathy.  Skin:    General: Skin is warm.     Findings: No rash.  Neurological:     Mental Status: He is alert and oriented to person, place, and time.     Cranial Nerves: No cranial nerve deficit.     Deep Tendon Reflexes: Reflexes are normal and symmetric.     BP 138/80   Pulse 80   Ht 5\' 10"  (1.778 m)   Wt 232 lb (105.2 kg)   BMI 33.29 kg/m   Assessment and Plan:  There are no diagnoses linked to this encounter. 1. Acute maxillary sinusitis, recurrence not specified Acute. Leaving to go on trip this weekend. Start ZPack use as directed - azithromycin (ZITHROMAX) 250 MG tablet; 2 today then 1 a day for 4 days  Dispense: 6  tablet; Refill: 0  2. Cough Non- productive cough - guaiFENesin-codeine (ROBITUSSIN AC) 100-10 MG/5ML syrup; Take 5 mLs by mouth 3 (three) times daily as needed for cough.  Dispense: 100 mL; Refill: 0

## 2018-04-20 ENCOUNTER — Ambulatory Visit (INDEPENDENT_AMBULATORY_CARE_PROVIDER_SITE_OTHER): Payer: BLUE CROSS/BLUE SHIELD | Admitting: Family Medicine

## 2018-04-20 ENCOUNTER — Encounter: Payer: Self-pay | Admitting: Family Medicine

## 2018-04-20 VITALS — BP 130/80 | HR 88 | Ht 70.0 in | Wt 233.0 lb

## 2018-04-20 DIAGNOSIS — J0121 Acute recurrent ethmoidal sinusitis: Secondary | ICD-10-CM

## 2018-04-20 DIAGNOSIS — Z23 Encounter for immunization: Secondary | ICD-10-CM | POA: Diagnosis not present

## 2018-04-20 MED ORDER — AZITHROMYCIN 250 MG PO TABS: ORAL_TABLET | ORAL | 0 refills | Status: DC

## 2018-04-20 NOTE — Addendum Note (Signed)
Addended by: Fredderick Severance on: 04/20/2018 10:43 AM   Modules accepted: Orders

## 2018-04-20 NOTE — Progress Notes (Signed)
Date:  04/20/2018   Name:  Marcus Baldwin [facility]   DOB:  1970-05-28   MRN:  [id number]   Chief Complaint: Sinusitis (headache, cong, throat drainage, ear pain, sore throat)  Sinusitis  This is a recurrent problem. The current episode started in the past 7 days. The problem has been gradually worsening since onset. There has been no fever. The pain is mild. Associated symptoms include congestion, coughing, ear pain, headaches, sinus pressure and a sore throat. Pertinent negatives include no chills, diaphoresis, hoarse voice, neck pain, shortness of breath, sneezing or swollen glands. (Post nasal drainage) Past treatments include acetaminophen and oral decongestants. The treatment provided no relief.    Review of Systems  Constitutional: Negative for chills, diaphoresis and fever.  HENT: Positive for congestion, ear pain, sinus pressure and sore throat. Negative for drooling, ear discharge, hoarse voice and sneezing.   Respiratory: Positive for cough. Negative for shortness of breath and wheezing.   Cardiovascular: Negative for chest pain, palpitations and leg swelling.  Gastrointestinal: Negative for abdominal pain, blood in stool, constipation, diarrhea and nausea.  Endocrine: Negative for polydipsia.  Genitourinary: Negative for dysuria, frequency, hematuria and urgency.  Musculoskeletal: Negative for back pain, myalgias and neck pain.  Skin: Negative for rash.  Allergic/Immunologic: Negative for environmental allergies.  Neurological: Positive for headaches. Negative for dizziness.  Hematological: Does not bruise/bleed easily.  Psychiatric/Behavioral: Negative for suicidal ideas. The patient is not nervous/anxious.     Patient Active Problem List   Diagnosis Date Noted  . Type 2 diabetes mellitus without complication, without long-term current use of insulin ([place]) 07/27/2017    No Known Allergies  Past Surgical History:  Procedure Laterality Date  . BREAST SURGERY Left    lumpectomy  . CHOLECYSTECTOMY    . KNEE ARTHROSCOPY Left     Social History   Tobacco Use  . Smoking status: Never Smoker  . Smokeless tobacco: Never Used  Substance Use Topics  . Alcohol use: Yes  . Drug use: No     Medication list has been reviewed and updated.  Current Meds  Medication Sig  . Dulaglutide (TRULICITY) 1.5 UD/1.4HF SOPN Inject 1.5 mg into the skin once a week. Dr Honor Junes  . metFORMIN (GLUCOPHAGE) 500 MG tablet Take 1 tablet (500 mg total) by mouth 2 (two) times daily with a meal.  . olmesartan (BENICAR) 20 MG tablet Take 1 tablet by mouth daily.    PHQ 2/9 Scores 03/03/2017 03/03/2017  PHQ - 2 Score 0 0  PHQ- 9 Score 0 -    Physical Exam Vitals signs and nursing note reviewed.  HENT:     Head: Normocephalic.     Right Ear: Hearing, tympanic membrane, ear canal and external ear normal.     Left Ear: Hearing, tympanic membrane, ear canal and external ear normal.     Nose: Nose normal.     Right Sinus: No maxillary sinus tenderness or frontal sinus tenderness.     Left Sinus: No maxillary sinus tenderness or frontal sinus tenderness.     Mouth/Throat:     Mouth: Mucous membranes are moist.     Pharynx: No pharyngeal swelling, oropharyngeal exudate, posterior oropharyngeal erythema or uvula swelling.  Eyes:     General: No scleral icterus.       Right eye: No discharge.        Left eye: No discharge.     Conjunctiva/sclera: Conjunctivae normal.     Pupils: Pupils are equal, round, and reactive to  light.  Neck:     Musculoskeletal: Normal range of motion and neck supple.     Thyroid: No thyromegaly.     Vascular: No JVD.     Trachea: No tracheal deviation.  Cardiovascular:     Rate and Rhythm: Normal rate and regular rhythm.     Heart sounds: Normal heart sounds. No murmur. No friction rub. No gallop.   Pulmonary:     Effort: No respiratory distress.     Breath sounds: Normal breath sounds. No wheezing or rales.  Abdominal:     General: Bowel  sounds are normal.     Palpations: Abdomen is soft. There is no mass.     Tenderness: There is no abdominal tenderness. There is no guarding or rebound.  Musculoskeletal: Normal range of motion.        General: No tenderness.  Lymphadenopathy:     Cervical: No cervical adenopathy.  Skin:    General: Skin is warm.     Findings: No rash.  Neurological:     Mental Status: He is alert and oriented to person, place, and time.     Cranial Nerves: No cranial nerve deficit.     Deep Tendon Reflexes: Reflexes are normal and symmetric.     BP 130/80   Pulse 88   Ht 5\' 10"  (1.778 m)   Wt 233 lb (105.7 kg)   BMI 33.43 kg/m   Assessment and Plan: 1. Acute recurrent ethmoidal sinusitis Recurrent.  Getting ready to fly to [place].  Will reinitiate azithromycin 250 mg 2 today followed by 1 a day for 4 days.  Patient will take pseudoephedrine 30 mg every 6 to 8 hours and Flonase in preparation for the flight. - azithromycin (ZITHROMAX) 250 MG tablet; 2 today then 1 a day for 4 days  Dispense: 6 tablet; Refill: 0

## 2018-06-02 ENCOUNTER — Ambulatory Visit (INDEPENDENT_AMBULATORY_CARE_PROVIDER_SITE_OTHER): Payer: BLUE CROSS/BLUE SHIELD

## 2018-06-02 DIAGNOSIS — Z23 Encounter for immunization: Secondary | ICD-10-CM

## 2018-09-11 DIAGNOSIS — E1169 Type 2 diabetes mellitus with other specified complication: Secondary | ICD-10-CM | POA: Diagnosis not present

## 2018-09-11 DIAGNOSIS — E1165 Type 2 diabetes mellitus with hyperglycemia: Secondary | ICD-10-CM | POA: Diagnosis not present

## 2018-09-11 DIAGNOSIS — E785 Hyperlipidemia, unspecified: Secondary | ICD-10-CM | POA: Diagnosis not present

## 2018-09-11 DIAGNOSIS — E782 Mixed hyperlipidemia: Secondary | ICD-10-CM | POA: Diagnosis not present

## 2018-12-07 ENCOUNTER — Other Ambulatory Visit: Payer: Self-pay

## 2018-12-07 ENCOUNTER — Ambulatory Visit: Payer: BC Managed Care – PPO | Admitting: Family Medicine

## 2018-12-07 ENCOUNTER — Encounter: Payer: Self-pay | Admitting: Family Medicine

## 2018-12-07 VITALS — BP 120/78 | HR 76 | Ht 70.0 in | Wt 233.0 lb

## 2018-12-07 DIAGNOSIS — Z1211 Encounter for screening for malignant neoplasm of colon: Secondary | ICD-10-CM | POA: Diagnosis not present

## 2018-12-07 DIAGNOSIS — N411 Chronic prostatitis: Secondary | ICD-10-CM

## 2018-12-07 DIAGNOSIS — R351 Nocturia: Secondary | ICD-10-CM

## 2018-12-07 DIAGNOSIS — E119 Type 2 diabetes mellitus without complications: Secondary | ICD-10-CM

## 2018-12-07 LAB — POCT URINALYSIS DIPSTICK
Bilirubin, UA: NEGATIVE
Blood, UA: NEGATIVE
Glucose, UA: NEGATIVE
Ketones, UA: NEGATIVE
Leukocytes, UA: NEGATIVE
Nitrite, UA: NEGATIVE
Protein, UA: NEGATIVE
Spec Grav, UA: 1.01 (ref 1.010–1.025)
Urobilinogen, UA: 0.2 E.U./dL
pH, UA: 5 (ref 5.0–8.0)

## 2018-12-07 LAB — HEMOCCULT GUIAC POC 1CARD (OFFICE): Fecal Occult Blood, POC: NEGATIVE

## 2018-12-07 MED ORDER — SULFAMETHOXAZOLE-TRIMETHOPRIM 800-160 MG PO TABS: 1.0000 | ORAL_TABLET | Freq: Two times a day (BID) | ORAL | 0 refills | Status: DC

## 2018-12-07 NOTE — Patient Instructions (Signed)

## 2018-12-07 NOTE — Progress Notes (Signed)
Date:  12/07/2018   Name:  Marcus Baldwin [facility]   DOB:  [birth year]   MRN:  [id number]   Chief Complaint: Nocturia (getting up every 1 to 1 and 1/2 hours to urinate)  Male GU Problem The patient's pertinent negatives include no genital injury, genital itching, genital lesions, pelvic pain, penile discharge, penile pain, scrotal swelling or testicular pain. This is a new problem. The current episode started in the past 7 days. The problem occurs intermittently. The problem has been gradually worsening. Associated symptoms include frequency and hematuria. Pertinent negatives include no abdominal pain, chest pain, chills, constipation, coughing, diarrhea, dysuria, fever, headaches, hesitancy, nausea, rash, shortness of breath, sore throat, urgency or vomiting. There is no history of BPH.  Benign Prostatic Hypertrophy The problem has been gradually worsening since onset. Irritative symptoms include frequency. Irritative symptoms do not include urgency. Obstructive symptoms do not include dribbling, incomplete emptying, an intermittent stream, a slower stream, straining or a weak stream. Pertinent negatives include no chills, dysuria, hematuria, hesitancy, nausea or vomiting.    Review of Systems  Constitutional: Negative for chills and fever.  HENT: Negative for drooling, ear discharge, ear pain and sore throat.   Respiratory: Negative for cough, shortness of breath and wheezing.   Cardiovascular: Negative for chest pain, palpitations and leg swelling.  Gastrointestinal: Negative for abdominal pain, blood in stool, constipation, diarrhea, nausea and vomiting.  Endocrine: Negative for polydipsia.  Genitourinary: Positive for frequency. Negative for difficulty urinating, discharge, dysuria, enuresis, hematuria, hesitancy, incomplete emptying, pelvic pain, penile pain, penile swelling, scrotal swelling, testicular pain and urgency.  Musculoskeletal: Negative for back pain, myalgias and neck pain.   Skin: Negative for rash.  Allergic/Immunologic: Negative for environmental allergies.  Neurological: Negative for dizziness and headaches.  Hematological: Does not bruise/bleed easily.  Psychiatric/Behavioral: Negative for suicidal ideas. The patient is not nervous/anxious.     Patient Active Problem List   Diagnosis Date Noted  . Type 2 diabetes mellitus without complication, without long-term current use of insulin ([place]) 07/27/2017    No Known Allergies  Past Surgical History:  Procedure Laterality Date  . BREAST SURGERY Left    lumpectomy  . CHOLECYSTECTOMY    . KNEE ARTHROSCOPY Left     Social History   Tobacco Use  . Smoking status: Never Smoker  . Smokeless tobacco: Never Used  Substance Use Topics  . Alcohol use: Yes  . Drug use: No     Medication list has been reviewed and updated.  Current Meds  Medication Sig  . Dulaglutide (TRULICITY) 1.5 0000000 SOPN Inject 1.5 mg into the skin once a week. Dr Honor Junes  . metFORMIN (GLUCOPHAGE) 1000 MG tablet Take 1,000 mg by mouth 2 (two) times daily with a meal. Dr Honor Junes  . olmesartan (BENICAR) 20 MG tablet Take 1 tablet by mouth daily.  . rosuvastatin (CRESTOR) 5 MG tablet Take 5 mg by mouth daily.  . [DISCONTINUED] metFORMIN (GLUCOPHAGE) 500 MG tablet Take 1 tablet (500 mg total) by mouth 2 (two) times daily with a meal.    PHQ 2/9 Scores 12/07/2018 03/03/2017 03/03/2017  PHQ - 2 Score 0 0 0  PHQ- 9 Score 0 0 -    BP Readings from Last 3 Encounters:  12/07/18 120/78  04/20/18 130/80  03/30/18 138/80    Physical Exam Vitals signs and nursing note reviewed.  HENT:     Head: Normocephalic.     Right Ear: Tympanic membrane, ear canal and external ear normal.  Left Ear: Tympanic membrane, ear canal and external ear normal.     Nose: Nose normal. No congestion or rhinorrhea.  Eyes:     General: No scleral icterus.       Right eye: No discharge.        Left eye: No discharge.     Conjunctiva/sclera:  Conjunctivae normal.     Pupils: Pupils are equal, round, and reactive to light.  Neck:     Musculoskeletal: Normal range of motion and neck supple.     Thyroid: No thyromegaly.     Vascular: No carotid bruit or JVD.     Trachea: No tracheal deviation.  Cardiovascular:     Rate and Rhythm: Normal rate and regular rhythm.     Pulses: Normal pulses.     Heart sounds: Normal heart sounds. No murmur. No friction rub. No gallop.   Pulmonary:     Effort: No respiratory distress.     Breath sounds: Normal breath sounds. No wheezing, rhonchi or rales.  Abdominal:     General: Bowel sounds are normal.     Palpations: Abdomen is soft. There is no mass.     Tenderness: There is no abdominal tenderness. There is no guarding or rebound.  Genitourinary:    Penis: Normal.      Scrotum/Testes: Normal.        Right: Tenderness not present.        Left: Tenderness not present.     Epididymis:     Right: Normal.     Left: Normal.     Prostate: Enlarged. Not tender and no nodules present.     Rectum: Normal. Guaiac result negative. No mass.     Comments: Slightly boggy Musculoskeletal: Normal range of motion.        General: No tenderness.  Lymphadenopathy:     Cervical: No cervical adenopathy.  Skin:    General: Skin is warm.     Findings: No rash.  Neurological:     Mental Status: He is alert and oriented to person, place, and time.     Cranial Nerves: No cranial nerve deficit.     Deep Tendon Reflexes: Reflexes are normal and symmetric.     Wt Readings from Last 3 Encounters:  12/07/18 233 lb (105.7 kg)  04/20/18 233 lb (105.7 kg)  03/30/18 232 lb (105.2 kg)    BP 120/78   Pulse 76   Ht 5\' 10"  (1.778 m)   Wt 233 lb (105.7 kg)   BMI 33.43 kg/m   Assessment and Plan:  1. Nocturia Patient has had increased nocturia over the past week to the point that he is only resting for about an hour before he can get to sleep and is back up again.  Urinalysis notes no infection of the  urine. - POCT urinalysis dipstick  2. Subacute prostatitis Examination of the prostate is mildly enlarged and slightly boggy in consistency consistent with subacute prostatitis there is some mild tenderness and feeling as if he needs to go to the urinate on evaluation.  Will initiate Septra DS twice daily for 2 weeks.  3. Type 2 diabetes mellitus without complication, without long-term current use of insulin ([place]) Followed by Dr. O'Connell/endocrinology.  Currently on Trulicity and metformin.  Review of A1c's are in the 7 range and reasonably controlled with fasting.  I have asked the patient to start checking a fingerstick glucose at 8 PM and at 8 AM to see what the nocturnal glucose is prior  to sleeping.  4. Colon cancer screening Guaiac was noted to be negative when told that he needed to get scheduled for colonoscopy patient said I will get back to you about it. - POCT Occult Blood Stool

## 2018-12-11 ENCOUNTER — Other Ambulatory Visit: Payer: Self-pay

## 2018-12-11 DIAGNOSIS — R351 Nocturia: Secondary | ICD-10-CM

## 2018-12-11 DIAGNOSIS — N411 Chronic prostatitis: Secondary | ICD-10-CM

## 2018-12-11 NOTE — Progress Notes (Unsigned)
Put in ref to urology

## 2018-12-25 ENCOUNTER — Other Ambulatory Visit: Payer: Self-pay | Admitting: Family Medicine

## 2018-12-25 DIAGNOSIS — N410 Acute prostatitis: Secondary | ICD-10-CM

## 2018-12-26 ENCOUNTER — Other Ambulatory Visit: Payer: Self-pay

## 2018-12-26 ENCOUNTER — Other Ambulatory Visit
Admission: RE | Admit: 2018-12-26 | Discharge: 2018-12-26 | Disposition: A | Discharge status: Home / Self Care | Payer: BC Managed Care – PPO | Attending: Urology | Admitting: Urology

## 2018-12-26 ENCOUNTER — Ambulatory Visit: Payer: BC Managed Care – PPO | Admitting: Urology

## 2018-12-26 ENCOUNTER — Encounter: Payer: Self-pay | Admitting: Urology

## 2018-12-26 VITALS — BP 147/87 | HR 111 | Ht 70.0 in | Wt 233.0 lb

## 2018-12-26 DIAGNOSIS — R351 Nocturia: Secondary | ICD-10-CM

## 2018-12-26 DIAGNOSIS — N410 Acute prostatitis: Secondary | ICD-10-CM | POA: Insufficient documentation

## 2018-12-26 LAB — URINALYSIS, COMPLETE (UACMP) WITH MICROSCOPIC
Bacteria, UA: NONE SEEN
Bilirubin Urine: NEGATIVE
Glucose, UA: NEGATIVE mg/dL
Hgb urine dipstick: NEGATIVE
Ketones, ur: NEGATIVE mg/dL
Leukocytes,Ua: NEGATIVE
Nitrite: NEGATIVE
Protein, ur: NEGATIVE mg/dL
Specific Gravity, Urine: 1.025 (ref 1.005–1.030)
Squamous Epithelial / HPF: NONE SEEN (ref 0–5)
WBC, UA: NONE SEEN WBC/hpf (ref 0–5)
pH: 5 (ref 5.0–8.0)

## 2018-12-26 LAB — BLADDER SCAN AMB NON-IMAGING

## 2018-12-26 NOTE — Progress Notes (Addendum)
   12/26/18 1:27 PM   [place] 1970-11-12 [id number]  Referring provider: Juline Patch, MD [address] [place],  [place] [address]  CC: Nocturia  HPI: I saw Marcus Baldwin in urology clinic in consultation from Dr. Ronnald Ramp for nocturia.  Briefly, he is a 48 year old male with well-controlled type 2 diabetes who presents with 4 weeks of nocturia 3-6 times per night.  He was seen by his PCP and urinalysis was benign, and he was diagnosed with possible prostatitis and started on Bactrim.  He took a few doses of this medication but noticed severe nausea and vomiting and discontinued this.  He reports his urinary symptoms have almost completely resolved.  He denies any urinary symptoms at all during the day.  He has nocturia 0-1 time per night, and one time last week got up 6 times to urinate overnight.  He denies a direct family history of prostate cancer.  He does report he is a heavy snorer, but has never been evaluated for sleep apnea.  He denies any gross hematuria, dysuria, or flank pain.  There are no aggravating or alleviating factors.  Severity is mild.  He drinks primarily water during the day.  He minimizes his fluids after 7 PM.  He denies any significant urgency or urge incontinence.  PVR 40mL in clinic today.  PMH: Past Medical History:  Diagnosis Date  . Allergy     Surgical History: Past Surgical History:  Procedure Laterality Date  . BREAST SURGERY Left    lumpectomy  . CHOLECYSTECTOMY    . KNEE ARTHROSCOPY Left     Allergies: No Known Allergies  Family History: Family History  Problem Relation Age of Onset  . Diabetes Father   . Cancer Maternal Uncle     Social History:  reports that he has never smoked. He has never used smokeless tobacco. He reports current alcohol use. He reports that he does not use drugs.  ROS: Please see flowsheet from today's date for complete review of systems.  Physical Exam: BP (!) 147/87 (BP Location: Left  Arm, Patient Position: Sitting, Cuff Size: Normal)   Pulse (!) 111   Ht 5\' 10"  (1.778 m)   Wt 233 lb (105.7 kg)   BMI 33.43 kg/m    Constitutional:  Alert and oriented, No acute distress. Cardiovascular: No clubbing, cyanosis, or edema. Respiratory: Normal respiratory effort, no increased work of breathing. GI: Abdomen is soft, nontender, nondistended, no abdominal masses Lymph: No cervical or inguinal lymphadenopathy. Skin: No rashes, bruises or suspicious lesions. Neurologic: Grossly intact, no focal deficits, moving all 4 extremities. Psychiatric: Normal mood and affect.  Laboratory Data: Urinalysis today no bacteria, nitrite negative, 0-5 RBCs, 0 WBCs  Pertinent Imaging: None to review  Assessment & Plan:   In summary, the patient is a 48 year old male with type 2 diabetes with a few weeks of significant nocturia that have since resolved spontaneously.  He is a heavy snorer, and I highly suspect sleep apnea as a cause of his nocturia.  He has no clinical or laboratory signs of infection, and no daytime urinary symptoms.  Recommend evaluation for sleep apnea/CPAP Follow-up as needed or if symptoms worsening Recommend routine PSA screening starting age 72    Billey Co, [place] [address], [place], [place] [address] [phone]

## 2018-12-26 NOTE — Patient Instructions (Signed)

## 2019-01-01 ENCOUNTER — Observation Stay
Admission: EM | Admit: 2019-01-01 | Discharge: 2019-01-03 | Disposition: A | Discharge status: Home / Self Care | Payer: BC Managed Care – PPO | Attending: Internal Medicine | Admitting: Internal Medicine

## 2019-01-01 ENCOUNTER — Emergency Department: Payer: BC Managed Care – PPO

## 2019-01-01 ENCOUNTER — Other Ambulatory Visit: Payer: Self-pay

## 2019-01-01 ENCOUNTER — Encounter: Payer: Self-pay | Admitting: *Deleted

## 2019-01-01 DIAGNOSIS — Z03818 Encounter for observation for suspected exposure to other biological agents ruled out: Secondary | ICD-10-CM | POA: Diagnosis not present

## 2019-01-01 DIAGNOSIS — I6782 Cerebral ischemia: Secondary | ICD-10-CM | POA: Insufficient documentation

## 2019-01-01 DIAGNOSIS — R030 Elevated blood-pressure reading, without diagnosis of hypertension: Secondary | ICD-10-CM | POA: Diagnosis not present

## 2019-01-01 DIAGNOSIS — E669 Obesity, unspecified: Secondary | ICD-10-CM | POA: Insufficient documentation

## 2019-01-01 DIAGNOSIS — E119 Type 2 diabetes mellitus without complications: Secondary | ICD-10-CM | POA: Insufficient documentation

## 2019-01-01 DIAGNOSIS — E785 Hyperlipidemia, unspecified: Secondary | ICD-10-CM | POA: Insufficient documentation

## 2019-01-01 DIAGNOSIS — I629 Nontraumatic intracranial hemorrhage, unspecified: Secondary | ICD-10-CM | POA: Insufficient documentation

## 2019-01-01 DIAGNOSIS — Z7984 Long term (current) use of oral hypoglycemic drugs: Secondary | ICD-10-CM | POA: Diagnosis not present

## 2019-01-01 DIAGNOSIS — Z79899 Other long term (current) drug therapy: Secondary | ICD-10-CM | POA: Diagnosis not present

## 2019-01-01 DIAGNOSIS — D1800 Hemangioma unspecified site: Secondary | ICD-10-CM | POA: Diagnosis not present

## 2019-01-01 DIAGNOSIS — Z6833 Body mass index (BMI) 33.0-33.9, adult: Secondary | ICD-10-CM | POA: Insufficient documentation

## 2019-01-01 DIAGNOSIS — Z20828 Contact with and (suspected) exposure to other viral communicable diseases: Secondary | ICD-10-CM | POA: Diagnosis not present

## 2019-01-01 DIAGNOSIS — D1802 Hemangioma of intracranial structures: Secondary | ICD-10-CM | POA: Diagnosis not present

## 2019-01-01 DIAGNOSIS — R569 Unspecified convulsions: Secondary | ICD-10-CM | POA: Diagnosis not present

## 2019-01-01 LAB — TROPONIN I (HIGH SENSITIVITY): Troponin I (High Sensitivity): 2 ng/L (ref <18)

## 2019-01-01 LAB — URINALYSIS, COMPLETE (UACMP) WITH MICROSCOPIC
Bacteria, UA: NONE SEEN
Bilirubin Urine: NEGATIVE
Glucose, UA: 150 mg/dL — AB
Hgb urine dipstick: NEGATIVE
Ketones, ur: NEGATIVE mg/dL
Leukocytes,Ua: NEGATIVE
Nitrite: NEGATIVE
Protein, ur: NEGATIVE mg/dL
Specific Gravity, Urine: 1.015 (ref 1.005–1.030)
Squamous Epithelial / HPF: NONE SEEN (ref 0–5)
pH: 7 (ref 5.0–8.0)

## 2019-01-01 LAB — URINE DRUG SCREEN, QUALITATIVE (ARMC ONLY)
Amphetamines, Ur Screen: NOT DETECTED
Barbiturates, Ur Screen: NOT DETECTED
Benzodiazepine, Ur Scrn: NOT DETECTED
Cannabinoid 50 Ng, Ur ~~LOC~~: NOT DETECTED
Cocaine Metabolite,Ur ~~LOC~~: NOT DETECTED
MDMA (Ecstasy)Ur Screen: NOT DETECTED
Methadone Scn, Ur: NOT DETECTED
Opiate, Ur Screen: NOT DETECTED
Phencyclidine (PCP) Ur S: NOT DETECTED
Tricyclic, Ur Screen: NOT DETECTED

## 2019-01-01 LAB — CBC
HCT: 37.7 % — ABNORMAL LOW (ref 39.0–52.0)
Hemoglobin: 13.3 g/dL (ref 13.0–17.0)
MCH: 30.7 pg (ref 26.0–34.0)
MCHC: 35.3 g/dL (ref 30.0–36.0)
MCV: 87.1 fL (ref 80.0–100.0)
Platelets: 178 10*3/uL (ref 150–400)
RBC: 4.33 MIL/uL (ref 4.22–5.81)
RDW: 12.3 % (ref 11.5–15.5)
WBC: 6.5 10*3/uL (ref 4.0–10.5)
nRBC: 0 % (ref 0.0–0.2)

## 2019-01-01 LAB — COMPREHENSIVE METABOLIC PANEL
ALT: 53 U/L — ABNORMAL HIGH (ref 0–44)
AST: 29 U/L (ref 15–41)
Albumin: 3.9 g/dL (ref 3.5–5.0)
Alkaline Phosphatase: 41 U/L (ref 38–126)
Anion gap: 11 (ref 5–15)
BUN: 18 mg/dL (ref 6–20)
CO2: 25 mmol/L (ref 22–32)
Calcium: 9.1 mg/dL (ref 8.9–10.3)
Chloride: 96 mmol/L — ABNORMAL LOW (ref 98–111)
Creatinine, Ser: 0.92 mg/dL (ref 0.61–1.24)
GFR calc Af Amer: >60 mL/min (ref >60)
GFR calc non Af Amer: >60 mL/min (ref >60)
Glucose, Bld: 239 mg/dL — ABNORMAL HIGH (ref 70–99)
Potassium: 3.7 mmol/L (ref 3.5–5.1)
Sodium: 132 mmol/L — ABNORMAL LOW (ref 135–145)
Total Bilirubin: 0.8 mg/dL (ref 0.3–1.2)
Total Protein: 7.3 g/dL (ref 6.5–8.1)

## 2019-01-01 LAB — GLUCOSE, CAPILLARY: Glucose-Capillary: 238 mg/dL — ABNORMAL HIGH (ref 70–99)

## 2019-01-01 MED ORDER — SODIUM CHLORIDE 0.9 % IV BOLUS
1000.0000 mL | Freq: Once | INTRAVENOUS | Status: AC
  Administered 2019-01-01: 22:00:00 1000 mL via INTRAVENOUS

## 2019-01-01 NOTE — ED Triage Notes (Addendum)
PT to ED reporting he was eating dinner this evening and talking to his wife when his wife states he stopped talking mid sentence and his eyes rolled back in his head. Pt started "twitching" and was not responding to wife or daughter. Pt has no memory of the event and denies symptoms at this time other than mild left sided chest pain.  NO neuro deficits noted at this time.   Pt also reports he last remembers feeling "flushed" and nauseous

## 2019-01-01 NOTE — ED Notes (Signed)
Dr. Joan Mayans at the bedside for pt evaluation

## 2019-01-01 NOTE — ED Notes (Addendum)
Dr. Joan Mayans at the bedside to discuss pt results and plan of care.

## 2019-01-01 NOTE — ED Provider Notes (Signed)
[facility] Emergency Department Provider Note  ____________________________________________   First MD Initiated Contact with Patient 01/01/19 2102     (approximate)  I have reviewed the triage vital signs and the nursing notes.  History  Chief Complaint Seizure-like activity    HPI Marcus Baldwin is a 48 y.o. male with history of obesity, HLD, diabetes who presents to the ED for questionable seizure like activity.  Patient reports this evening while eating dinner he began to feel flushed/warm and complained of nausea.  After this, he cannot recall what happened.  Wife at bedside states after he complained of nausea he became seemingly unresponsive, staring off into space.  She describes rhythmic jerking movements of his upper and lower extremity.  He would not interact with her during this episode.  This lasted in total approximately 1 minute.  Afterwards he was immediately alert and oriented, but does not recall the event.  No urinary incontinence, no tongue biting.  He was immediately aware of who his wife was.  Their daughter who is a CNA checked his blood glucose and it was within normal limits.  He denies any history of seizures or arrhythmias.  On the way to the emergency department he had a very brief, now resolved episode of chest discomfort.  Prior to the event, he denies any preceding chest pain, palpitations.  He denies any recent illnesses, heavy alcohol use.  On arrival to the emergency department he feels well, is asymptomatic, and acting at his baseline per wife.   Past Medical Hx Past Medical History:  Diagnosis Date  . Allergy   . Prostatitis     Problem List Patient Active Problem List   Diagnosis Date Noted  . DDD (degenerative disc disease), cervical 08/08/2017  . Type 2 diabetes mellitus without complication, without long-term current use of insulin ([place]) 07/27/2017  . Obesity (BMI 30.0-34.9) 03/11/2017  . Hyperlipidemia due  to type 2 diabetes mellitus ([place]) 01/28/2017    Past Surgical Hx Past Surgical History:  Procedure Laterality Date  . BREAST SURGERY Left    lumpectomy  . CHOLECYSTECTOMY    . KNEE ARTHROSCOPY Left     Medications Prior to Admission medications   Medication Sig Start Date End Date Taking? Authorizing Provider  Dulaglutide (TRULICITY) 1.5 0000000 SOPN Inject 1.5 mg into the skin once a week. Dr Honor Junes 01/28/17   [provider]  metFORMIN (GLUCOPHAGE) 1000 MG tablet Take 1,000 mg by mouth 2 (two) times daily with a meal. Dr Honor Junes 11/15/18   [provider]  olmesartan (BENICAR) 20 MG tablet Take 1 tablet by mouth daily. 12/06/17 12/07/18  [provider]  ONETOUCH ULTRA test strip  12/17/18   [provider]  rosuvastatin (CRESTOR) 5 MG tablet Take 5 mg by mouth daily. 11/22/18   [provider]  sulfamethoxazole-trimethoprim (BACTRIM DS) 800-160 MG tablet Take 1 tablet by mouth 2 (two) times daily. 12/07/18   Juline Patch, MD    Allergies Patient has no known allergies.  Family Hx Family History  Problem Relation Age of Onset  . Diabetes Father   . Cancer Maternal Uncle     Social Hx Social History   Tobacco Use  . Smoking status: Never Smoker  . Smokeless tobacco: Never Used  Substance Use Topics  . Alcohol use: Yes  . Drug use: No     Review of Systems  Constitutional: Negative for fever, chills. Eyes: Negative for visual changes. ENT: Negative for sore throat.  Cardiovascular: Negative for chest pain. Respiratory: Negative for shortness of breath. Gastrointestinal: Negative for nausea, vomiting.  Genitourinary: Negative for dysuria. Musculoskeletal: Negative for leg swelling. Skin: Negative for rash. Neurological: + seizure like activity   Physical Exam  Vital Signs: ED Triage Vitals  Enc Vitals Group     BP 01/01/19 1925 (!) 172/100     Pulse Rate 01/01/19 1925 (!) 114     Resp 01/01/19 1925 16      Temp 01/01/19 1925 98.9 F (37.2 C)     Temp Source 01/01/19 1925 Oral     SpO2 01/01/19 1925 99 %     Weight 01/01/19 1927 233 lb (105.7 kg)     Height 01/01/19 1927 5\' 10"  (1.778 m)     Head Circumference --      Peak Flow --      Pain Score 01/01/19 1926 0     Pain Loc --      Pain Edu? --      Excl. in [place]? --     Constitutional: Alert and oriented.  Head: Normocephalic. Atraumatic. Eyes: Conjunctivae clear. Sclera anicteric. Nose: No congestion. No rhinorrhea. Mouth/Throat: Mucous membranes are moist.  Neck: No stridor.   Cardiovascular: Tachycardic on arrival, resolved spontaneously w/o intervention. Normal rate, regular rhythm. No murmurs. Extremities well perfused. Respiratory: Normal respiratory effort.  Lungs CTAB. Gastrointestinal: Soft. Non-tender. Non-distended.  Musculoskeletal: No lower extremity edema. No deformities. Neurologic:  Normal speech and language. No gross focal neurologic deficits are appreciated. Alert and oriented.  Face symmetric.  Tongue midline.  Cranial nerves II through XII intact. UE and LE strength 5/5 and symmetric. UE and LE SILT.  Skin: Skin is warm, dry and intact. No rash noted. Psychiatric: Mood and affect are appropriate for situation.  EKG  Personally reviewed.   Rate: 109 Rhythm: Sinus Axis: Normal Intervals: Within normal limits Sinus tachycardia, no acute ischemic changes No evidence of [place], WPW, or prolonged QTC No STEMI    Radiology  CT: IMPRESSION:  Mild bifrontal periventricular deep white matter hypointensity which  can be seen in the setting of small vessel ischemia, hypercoagulable  state, vasculitis, migraines, prior infection or demyelination. If  further evaluation is required, would recommend MRI.    Procedures  Procedure(s) performed (including critical care):  Procedures   Initial Impression / Assessment and Plan / ED Course  48 y.o. male who presents to the ED for questionable seizure-like  activity, as described above.  On arrival, he has no complaints, and is a neurologically intact.  Ddx: seizure, arrhythmia, syncopal convulsions  Plan: labs, EKG, imaging  High-sensitivity troponin negative.  EKG without acute ischemia, and no evidence of arrhythmia.  Chemistry reveals mild hyponatremia (132), hypochloremia (96), hyperglycemia (239).  Hyponatremia does not seem significant enough to explain any seizure-like activity.  Will give bolus of fluids.  CBC unremarkable.  CT head as above, will follow up with MRI.  If negative, anticipate discharge with cardiology, neurology follow-up.  Patient is agreeable with plan.  Final Clinical Impression(s) / ED Diagnosis  Final diagnoses:  Seizure-like activity [facility])       Note:  This document was prepared using Dragon voice recognition software and may include unintentional dictation errors.   Lilia Pro., MD 01/01/19 2337

## 2019-01-01 NOTE — ED Notes (Signed)
Pt to CT

## 2019-01-02 DIAGNOSIS — Q283 Other malformations of cerebral vessels: Secondary | ICD-10-CM | POA: Diagnosis not present

## 2019-01-02 DIAGNOSIS — I629 Nontraumatic intracranial hemorrhage, unspecified: Secondary | ICD-10-CM | POA: Diagnosis not present

## 2019-01-02 DIAGNOSIS — E669 Obesity, unspecified: Secondary | ICD-10-CM | POA: Diagnosis not present

## 2019-01-02 DIAGNOSIS — R569 Unspecified convulsions: Secondary | ICD-10-CM | POA: Diagnosis not present

## 2019-01-02 DIAGNOSIS — I6782 Cerebral ischemia: Secondary | ICD-10-CM | POA: Diagnosis not present

## 2019-01-02 DIAGNOSIS — I10 Essential (primary) hypertension: Secondary | ICD-10-CM | POA: Diagnosis not present

## 2019-01-02 LAB — GLUCOSE, CAPILLARY
Glucose-Capillary: 146 mg/dL — ABNORMAL HIGH (ref 70–99)
Glucose-Capillary: 127 mg/dL — ABNORMAL HIGH (ref 70–99)
Glucose-Capillary: 115 mg/dL — ABNORMAL HIGH (ref 70–99)
Glucose-Capillary: 114 mg/dL — ABNORMAL HIGH (ref 70–99)

## 2019-01-02 LAB — HIV ANTIBODY (ROUTINE TESTING W REFLEX): HIV Screen 4th Generation wRfx: NONREACTIVE

## 2019-01-02 LAB — TSH: TSH: 2.699 u[IU]/mL (ref 0.350–4.500)

## 2019-01-02 LAB — HEMOGLOBIN A1C
Hgb A1c MFr Bld: 7.8 % — ABNORMAL HIGH (ref 4.8–5.6)
Mean Plasma Glucose: 177.16 mg/dL

## 2019-01-02 LAB — SARS CORONAVIRUS 2 (TAT 6-24 HRS): SARS Coronavirus 2: NEGATIVE

## 2019-01-02 MED ORDER — INSULIN ASPART 100 UNIT/ML ~~LOC~~ SOLN: 0.0000 [IU] | Freq: Every day | SUBCUTANEOUS | Status: DC

## 2019-01-02 MED ORDER — LABETALOL HCL 5 MG/ML IV SOLN: 5.0000 mg | INTRAVENOUS | Status: DC | PRN

## 2019-01-02 MED ORDER — ONDANSETRON HCL 4 MG PO TABS: 4.0000 mg | ORAL_TABLET | Freq: Four times a day (QID) | ORAL | Status: DC | PRN

## 2019-01-02 MED ORDER — GADOBUTROL 1 MMOL/ML IV SOLN
10.0000 mL | Freq: Once | INTRAVENOUS | Status: AC | PRN
  Administered 2019-01-02: 10 mL via INTRAVENOUS

## 2019-01-02 MED ORDER — IRBESARTAN 150 MG PO TABS
150.0000 mg | ORAL_TABLET | Freq: Every day | ORAL | Status: DC
  Administered 2019-01-03: 10:00:00 150 mg via ORAL
  Filled 2019-01-02: qty 1

## 2019-01-02 MED ORDER — ACETAMINOPHEN 650 MG RE SUPP: 650.0000 mg | Freq: Four times a day (QID) | RECTAL | Status: DC | PRN

## 2019-01-02 MED ORDER — LEVETIRACETAM IN NACL 1500 MG/100ML IV SOLN
1500.0000 mg | Freq: Once | INTRAVENOUS | Status: AC
  Administered 2019-01-02: 1500 mg via INTRAVENOUS
  Filled 2019-01-02: qty 100

## 2019-01-02 MED ORDER — ACETAMINOPHEN 325 MG PO TABS
650.0000 mg | ORAL_TABLET | Freq: Four times a day (QID) | ORAL | Status: DC | PRN
  Administered 2019-01-02: 17:00:00 650 mg via ORAL
  Filled 2019-01-02: qty 2

## 2019-01-02 MED ORDER — LEVETIRACETAM 500 MG PO TABS
500.0000 mg | ORAL_TABLET | Freq: Two times a day (BID) | ORAL | Status: DC
  Administered 2019-01-02 – 2019-01-03 (×3): 500 mg via ORAL
  Filled 2019-01-02 (×4): qty 1

## 2019-01-02 MED ORDER — INSULIN ASPART 100 UNIT/ML ~~LOC~~ SOLN
0.0000 [IU] | Freq: Three times a day (TID) | SUBCUTANEOUS | Status: DC
  Administered 2019-01-02 (×2): 3 [IU] via SUBCUTANEOUS
  Administered 2019-01-03: 09:00:00 4 [IU] via SUBCUTANEOUS
  Filled 2019-01-02 (×3): qty 1

## 2019-01-02 MED ORDER — ROSUVASTATIN CALCIUM 10 MG PO TABS
5.0000 mg | ORAL_TABLET | Freq: Every day | ORAL | Status: DC
  Administered 2019-01-02: 5 mg via ORAL
  Filled 2019-01-02: qty 1

## 2019-01-02 MED ORDER — ONDANSETRON HCL 4 MG/2ML IJ SOLN: 4.0000 mg | Freq: Four times a day (QID) | INTRAMUSCULAR | Status: DC | PRN

## 2019-01-02 MED ORDER — SODIUM CHLORIDE 0.9 % IV SOLN
750.0000 mg | Freq: Once | INTRAVENOUS | Status: DC
  Filled 2019-01-02: qty 7.5

## 2019-01-02 MED ORDER — DOCUSATE SODIUM 100 MG PO CAPS
100.0000 mg | ORAL_CAPSULE | Freq: Two times a day (BID) | ORAL | Status: DC
  Administered 2019-01-02: 22:00:00 100 mg via ORAL
  Filled 2019-01-02 (×2): qty 1

## 2019-01-02 NOTE — Consult Note (Signed)
Reason for Consult: new onset sz Referring Physician: Hospitalist/nsg  CC: new onset sz  HPI: Marcus Baldwin is an 48 y.o. male  Admitted on the account of sz.  48 y/o with h/o HTN, DM2 admitted with sz like activity.  Patient does not recall the event. [name] was having dinner when event occurred. Last thing he remembers was talking to his wife the next thing remembered his wife yelling at him. Sz like activity lasted for about minute, patient was confused for short period of time afterwards.  His wife  told him that he was starring then started having jerking mvts of his UEX/LEX. Patient brought to ER Head CT obtained showing Mild bifrontal periventricular deep white matter hypointensity which can be seen in the setting of small vessel ischemia, hypercoagulable state, vasculitis, migraines, prior infection or demyelination. MRI obtained that showed round area of magnetic susceptibility effect in the medial righttemporal lobe, corresponding to a hyperdense focus on the earlierhead CT. 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 There is a small amount of adjacent edema.Chronic microvascular ischemia. Patient loaded with keppra.. labs with hyponatremia. Patient also evaluated by nsg.   this Am patient feeling well, no further events, no HA, no nausea/no vting, no weakness.  Past Medical History:  Diagnosis Date  . Allergy   . Prostatitis     Past Surgical History:  Procedure Laterality Date  . BREAST SURGERY Left    lumpectomy  . CHOLECYSTECTOMY    . KNEE ARTHROSCOPY Left     Family History  Problem Relation Age of Onset  . Diabetes Father   . Cancer Maternal Uncle     Social History:  reports that he has never smoked. He has never used smokeless tobacco. He reports current alcohol use. He reports that he does not use drugs.  No Known Allergies  Medications: I have reviewed the patient's current medications.  ROS: As per  HPI Physical Examination: Blood pressure (!) 146/91, pulse 78, temperature 97.7 F (36.5 C), temperature source Oral, resp. rate 16, height 5\' 10"  (1.778 m), weight 105 kg, SpO2 100 %.  Neurologic Examination Alert, Awake, speech nl;e, orientd x3, following commands PERLA, EOMI< VFF, no nystagmus, face symmetrical, face sensation nle, uvula and tongue midline No motor deficit appreciated No sensory deficit appreciated No coordination deficit appreciated DTR not checked at tim of exam Gait is nle  Results for orders placed or performed during the hospital encounter of 01/01/19 (from the past 48 hour(s))  Comprehensive metabolic panel     Status: Abnormal   Collection Time: 01/01/19  7:35 PM  Result Value Ref Range   Sodium 132 (L) 135 - 145 mmol/L   Potassium 3.7 3.5 - 5.1 mmol/L   Chloride 96 (L) 98 - 111 mmol/L   CO2 25 22 - 32 mmol/L   Glucose, Bld 239 (H) 70 - 99 mg/dL   BUN 18 6 - 20 mg/dL   Creatinine, Ser 0.92 0.61 - 1.24 mg/dL   Calcium 9.1 8.9 - 10.3 mg/dL   Total Protein 7.3 6.5 - 8.1 g/dL   Albumin 3.9 3.5 - 5.0 g/dL   AST 29 15 - 41 U/L   ALT 53 (H) 0 - 44 U/L   Alkaline Phosphatase 41 38 - 126 U/L   Total Bilirubin 0.8 0.3 - 1.2 mg/dL   GFR calc non Af Amer >60 >60 mL/min   GFR calc Af Amer >60 >60 mL/min   Anion gap 11 5 - 15  Comment: Performed at [facility], [address] [place], [place] [address]  CBC     Status: Abnormal   Collection Time: 01/01/19  7:35 PM  Result Value Ref Range   WBC 6.5 4.0 - 10.5 K/uL   RBC 4.33 4.22 - 5.81 MIL/uL   Hemoglobin 13.3 13.0 - 17.0 g/dL   HCT 37.7 (L) 39.0 - 52.0 %   MCV 87.1 80.0 - 100.0 fL   MCH 30.7 26.0 - 34.0 pg   MCHC 35.3 30.0 - 36.0 g/dL   RDW 12.3 11.5 - 15.5 %   Platelets 178 150 - 400 K/uL   nRBC 0.0 0.0 - 0.2 %    Comment: Performed at [facility], [place], [place] [address]  Troponin I (High Sensitivity)     Status: None   Collection Time: 01/01/19  7:35 PM   Result Value Ref Range   Troponin I (High Sensitivity) 2 <18 ng/L    Comment: (NOTE) Elevated high sensitivity troponin I (hsTnI) values and significant  changes across serial measurements may suggest ACS but many other  chronic and acute conditions are known to elevate hsTnI results.  Refer to the "Links" section for chest pain algorithms and additional  guidance. Performed at [facility] [address] [place], [place] [address]   Glucose, capillary     Status: Abnormal   Collection Time: 01/01/19  7:38 PM  Result Value Ref Range   Glucose-Capillary 238 (H) 70 - 99 mg/dL  Urinalysis, Complete w Microscopic     Status: Abnormal   Collection Time: 01/01/19 10:42 PM  Result Value Ref Range   Color, Urine YELLOW (A) YELLOW   APPearance CLEAR (A) CLEAR   Specific Gravity, Urine 1.015 1.005 - 1.030   pH 7.0 5.0 - 8.0   Glucose, UA 150 (A) NEGATIVE mg/dL   Hgb urine dipstick NEGATIVE NEGATIVE   Bilirubin Urine NEGATIVE NEGATIVE   Ketones, ur NEGATIVE NEGATIVE mg/dL   Protein, ur NEGATIVE NEGATIVE mg/dL   Nitrite NEGATIVE NEGATIVE   Leukocytes,Ua NEGATIVE NEGATIVE   WBC, UA 0-5 0 - 5 WBC/hpf   Bacteria, UA NONE SEEN NONE SEEN   Squamous Epithelial / LPF NONE SEEN 0 - 5    Comment: Performed at [facility], [address] [place], [place] [address]  Urine Drug Screen, Qualitative     Status: None   Collection Time: 01/01/19 10:42 PM  Result Value Ref Range   Tricyclic, Ur Screen NONE DETECTED NONE DETECTED   Amphetamines, Ur Screen NONE DETECTED NONE DETECTED   MDMA (Ecstasy)Ur Screen NONE DETECTED NONE DETECTED   Cocaine Metabolite,Ur [place] NONE DETECTED NONE DETECTED   Opiate, Ur Screen NONE DETECTED NONE DETECTED   Phencyclidine (PCP) Ur S NONE DETECTED NONE DETECTED   Cannabinoid 50 Ng, Ur [place] NONE DETECTED NONE DETECTED   Barbiturates, Ur Screen NONE DETECTED NONE DETECTED   Benzodiazepine, Ur Scrn NONE DETECTED NONE DETECTED   Methadone Scn, Ur NONE  DETECTED NONE DETECTED    Comment: (NOTE) Tricyclics + metabolites, urine    Cutoff 1000 ng/mL Amphetamines + metabolites, urine  Cutoff 1000 ng/mL MDMA (Ecstasy), urine              Cutoff 500 ng/mL Cocaine Metabolite, urine          Cutoff 300 ng/mL Opiate + metabolites, urine        Cutoff 300 ng/mL Phencyclidine (PCP), urine         Cutoff  25 ng/mL Cannabinoid, urine                 Cutoff 50 ng/mL Barbiturates + metabolites, urine  Cutoff 200 ng/mL Benzodiazepine, urine              Cutoff 200 ng/mL Methadone, urine                   Cutoff 300 ng/mL The urine drug screen provides only a preliminary, unconfirmed analytical test result and should not be used for non-medical purposes. Clinical consideration and professional judgment should be applied to any positive drug screen result due to possible interfering substances. A more specific alternate chemical method must be used in order to obtain a confirmed analytical result. Gas chromatography / mass spectrometry (GC/MS) is the preferred confirmat ory method. Performed at [facility], [address] [place], [place] [address]   TSH     Status: None   Collection Time: 01/02/19  5:37 AM  Result Value Ref Range   TSH 2.699 0.350 - 4.500 uIU/mL    Comment: Performed by a 3rd Generation assay with a functional sensitivity of <=0.01 uIU/mL. Performed at [facility], [address] [place], [place] [address]   Glucose, capillary     Status: Abnormal   Collection Time: 01/02/19  7:56 AM  Result Value Ref Range   Glucose-Capillary 146 (H) 70 - 99 mg/dL    No results found for this or any previous visit (from the past 240 hour(s)).  Ct Head Wo Contrast  Result Date: 01/01/2019 CLINICAL DATA:  Seizure EXAM: CT HEAD WITHOUT CONTRAST TECHNIQUE: Contiguous axial images were obtained from the base of the skull through the vertex without intravenous contrast. COMPARISON:  None. FINDINGS: Brain: There is  periventricular white matter hypoattenuation seen adjacent to the anterior horns of the lateral ventricles. No extra-axial collections. The ventricles are normal in size and contour. Vascular: No hyperdense vessel or unexpected calcification. Skull: The skull is intact. No fracture or focal lesion identified. Sinuses/Orbits: The visualized paranasal sinuses and mastoid air cells are clear. The orbits and globes intact. Other: None IMPRESSION: Mild bifrontal periventricular deep white matter hypointensity which can be seen in the setting of small vessel ischemia, hypercoagulable state, vasculitis, migraines, prior infection or demyelination. If further evaluation is required, would recommend MRI. Electronically Signed   By: Prudencio Pair M.D.   On: 01/01/2019 22:19   Mr Jeri Cos And Wo Contrast  Result Date: 01/02/2019 CLINICAL DATA:  Possible seizure EXAM: MRI HEAD WITHOUT AND WITH CONTRAST TECHNIQUE: Multiplanar, multiecho pulse sequences of the brain and surrounding structures were obtained without and with intravenous contrast. CONTRAST:  34mL GADAVIST GADOBUTROL 1 MMOL/ML IV SOLN COMPARISON:  Head CT 12/31/2018 FINDINGS: BRAIN: There is a focus of magnetic susceptibility effect in the medial right temporal lobe, corresponding to a mildly hyperdense focus on the head CT. There is no associated contrast enhancement. There is a small amount of surrounding hyperintense T2-weighted signal. Multifocal white matter hyperintensity, most commonly due to chronic ischemic microangiopathy. The cerebral and cerebellar volume are age-appropriate. There is no hydrocephalus. The midline structures are normal. VASCULAR: The major intracranial arterial and venous sinus flow voids are normal. Susceptibility-sensitive sequences show no chronic microhemorrhage or superficial siderosis. SKULL AND UPPER CERVICAL SPINE: Calvarial bone marrow signal is normal. There is no skull base mass. The visualized upper cervical spine and soft  tissues are normal. SINUSES/ORBITS: There are no fluid levels or advanced mucosal thickening. The mastoid  air cells and middle ear cavities are free of fluid. The orbits are normal. IMPRESSION: 1. Round area of magnetic susceptibility effect in the medial right temporal lobe, corresponding to a hyperdense focus on the earlier head CT. 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 There is a small amount of adjacent edema. This is a likely seizure etiology. 2. Chronic microvascular ischemia. These results were called by telephone at the time of interpretation on 01/02/2019 at 1:50 am to Dr. Beather Arbour, Who verbally acknowledged these results. Electronically Signed   By: Ulyses Jarred M.D.   On: 01/02/2019 01:50     Assessment/Plan: 48 y/o with h/o HTN, DM2 admitted with new onset seiziure in whom neuro exam is begnin and MRI showing round area of magnetic susceptibility effect in the medial righttemporal lobe, corresponding to a hyperdense focus on the earlierhead CT. 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 There is a small amount of adjacent edema.Chronic microvascular ischemia.   RECS: - Continue neuro protective measure includin normothermia, normoglycemia, correct electrolytes/metabolic abnlites, treat any infection while admitted - Keppra 500 mg BID - sz precautions - ativan prn for sz - PT/OT- he will need a follow up with neurology and neurosurgery as OTP.     01/02/2019, 9:08 AM

## 2019-01-02 NOTE — Consult Note (Signed)
Neurosurgery-New Consultation Evaluation 01/02/2019 Meagan Walling [facility] [id number]  Identifying Statement: Marcus Baldwin is a 48 y.o. male from [place] [id number] with first time seizure  Physician Requesting Consultation: [place] [facility]  History of Present Illness: Marcus Baldwin is here for evaluation of a first-time seizure.  He describes an episode while he was sitting down to eat where he started staring and became unresponsive with shaking of his extremities.  His wife was with him and he did recover and return immediately back to his baseline.  He denies any incontinence or tongue biting.  He has never had an episode like this before.  He denies any speech difficulties recently.  He is right-handed.  He presented to the emergency department where imaging of the head was obtained which did reveal a likely right mesial temporal lesion.  He was started on Keppra.  He was admitted for further neurological evaluation.  Past Medical History:  Past Medical History:  Diagnosis Date  . Allergy   . Prostatitis     Social History: Social History   Socioeconomic History  . Marital status: Married    Spouse name: Not on file  . Number of children: Not on file  . Years of education: Not on file  . Highest education level: Not on file  Occupational History  . Not on file  Social Needs  . Financial resource strain: Not on file  . Food insecurity    Worry: Not on file    Inability: Not on file  . Transportation needs    Medical: Not on file    Non-medical: Not on file  Tobacco Use  . Smoking status: Never Smoker  . Smokeless tobacco: Never Used  Substance and Sexual Activity  . Alcohol use: Yes  . Drug use: No  . Sexual activity: Yes  Lifestyle  . Physical activity    Days per week: Not on file    Minutes per session: Not on file  . Stress: Not on file  Relationships  . Social Herbalist on phone: Not on file    Gets together: Not on  file    Attends religious service: Not on file    Active member of club or organization: Not on file    Attends meetings of clubs or organizations: Not on file    Relationship status: Not on file  . Intimate partner violence    Fear of current or ex partner: Not on file    Emotionally abused: Not on file    Physically abused: Not on file    Forced sexual activity: Not on file  Other Topics Concern  . Not on file  Social History Narrative  . Not on file    Family History: Family History  Problem Relation Age of Onset  . Diabetes Father   . Cancer Maternal Uncle     Review of Systems:  Review of Systems - General ROS: Negative Psychological ROS: Negative Ophthalmic ROS: Negative ENT ROS: Negative Hematological and Lymphatic ROS: Negative  Endocrine ROS: Negative Respiratory ROS: Negative Cardiovascular ROS: Negative Gastrointestinal ROS: Negative Genito-Urinary ROS: Negative Musculoskeletal ROS: Negative Neurological ROS: Positive for seizure Dermatological ROS: Negative  Physical Exam: BP (!) 146/91   Pulse 78   Temp 97.7 F (36.5 C) (Oral)   Resp 16   Ht 5\' 10"  (1.778 m)   Wt 105 kg   SpO2 100%   BMI 33.21 kg/m  Body mass index is 33.21 kg/m. Body surface  area is 2.28 meters squared. General appearance: Alert, cooperative, in no acute distress, lying supine in bed Head: Normocephalic, atraumatic Eyes: Normal, EOM intact Oropharynx: Moist without lesions Neck: Supple, range of motion appears full Ext: No edema in LE bilaterally  Neurologic exam:  Mental status: alertness: alert, orientation: person, place, time, affect: normal Speech: fluent and clear, naming and repetition are intact Cranial nerves:  II: Visual fields are full by confrontation, no ptosis III/IV/VI: extra-ocular motions intact bilaterally V/VII:no evidence of facial droop or weakness VIII: hearing normal XI: trapezius strength symmetric,  sternocleidomastoid strength symmetric XII:  tongue strength symmetric  Motor:strength symmetric 5/5, normal muscle mass and tone in all extremities and no pronator drift Sensory: intact to light touch in all extremities Gait: Not tested  Laboratory: Results for orders placed or performed during the hospital encounter of 01/01/19  Comprehensive metabolic panel  Result Value Ref Range   Sodium 132 (L) 135 - 145 mmol/L   Potassium 3.7 3.5 - 5.1 mmol/L   Chloride 96 (L) 98 - 111 mmol/L   CO2 25 22 - 32 mmol/L   Glucose, Bld 239 (H) 70 - 99 mg/dL   BUN 18 6 - 20 mg/dL   Creatinine, Ser 0.92 0.61 - 1.24 mg/dL   Calcium 9.1 8.9 - 10.3 mg/dL   Total Protein 7.3 6.5 - 8.1 g/dL   Albumin 3.9 3.5 - 5.0 g/dL   AST 29 15 - 41 U/L   ALT 53 (H) 0 - 44 U/L   Alkaline Phosphatase 41 38 - 126 U/L   Total Bilirubin 0.8 0.3 - 1.2 mg/dL   GFR calc non Af Amer >60 >60 mL/min   GFR calc Af Amer >60 >60 mL/min   Anion gap 11 5 - 15  CBC  Result Value Ref Range   WBC 6.5 4.0 - 10.5 K/uL   RBC 4.33 4.22 - 5.81 MIL/uL   Hemoglobin 13.3 13.0 - 17.0 g/dL   HCT 37.7 (L) 39.0 - 52.0 %   MCV 87.1 80.0 - 100.0 fL   MCH 30.7 26.0 - 34.0 pg   MCHC 35.3 30.0 - 36.0 g/dL   RDW 12.3 11.5 - 15.5 %   Platelets 178 150 - 400 K/uL   nRBC 0.0 0.0 - 0.2 %  Glucose, capillary  Result Value Ref Range   Glucose-Capillary 238 (H) 70 - 99 mg/dL  Urinalysis, Complete w Microscopic  Result Value Ref Range   Color, Urine YELLOW (A) YELLOW   APPearance CLEAR (A) CLEAR   Specific Gravity, Urine 1.015 1.005 - 1.030   pH 7.0 5.0 - 8.0   Glucose, UA 150 (A) NEGATIVE mg/dL   Hgb urine dipstick NEGATIVE NEGATIVE   Bilirubin Urine NEGATIVE NEGATIVE   Ketones, ur NEGATIVE NEGATIVE mg/dL   Protein, ur NEGATIVE NEGATIVE mg/dL   Nitrite NEGATIVE NEGATIVE   Leukocytes,Ua NEGATIVE NEGATIVE   WBC, UA 0-5 0 - 5 WBC/hpf   Bacteria, UA NONE SEEN NONE SEEN   Squamous Epithelial / LPF NONE SEEN 0 - 5  Urine Drug Screen, Qualitative  Result Value Ref Range   Tricyclic, Ur  Screen NONE DETECTED NONE DETECTED   Amphetamines, Ur Screen NONE DETECTED NONE DETECTED   MDMA (Ecstasy)Ur Screen NONE DETECTED NONE DETECTED   Cocaine Metabolite,Ur [place] NONE DETECTED NONE DETECTED   Opiate, Ur Screen NONE DETECTED NONE DETECTED   Phencyclidine (PCP) Ur S NONE DETECTED NONE DETECTED   Cannabinoid 50 Ng, Ur [place] NONE DETECTED NONE DETECTED   Barbiturates, Ur  Screen NONE DETECTED NONE DETECTED   Benzodiazepine, Ur Scrn NONE DETECTED NONE DETECTED   Methadone Scn, Ur NONE DETECTED NONE DETECTED  TSH  Result Value Ref Range   TSH 2.699 0.350 - 4.500 uIU/mL  Glucose, capillary  Result Value Ref Range   Glucose-Capillary 146 (H) 70 - 99 mg/dL  Troponin I (High Sensitivity)  Result Value Ref Range   Troponin I (High Sensitivity) 2 <18 ng/L   I personally reviewed labs  Imaging: Results for orders placed during the hospital encounter of 01/01/19  MR Brain W and Wo Contrast   Narrative CLINICAL DATA:  Possible seizure  EXAM: MRI HEAD WITHOUT AND WITH CONTRAST  TECHNIQUE: Multiplanar, multiecho pulse sequences of the brain and surrounding structures were obtained without and with intravenous contrast.  CONTRAST:  85mL GADAVIST GADOBUTROL 1 MMOL/ML IV SOLN  COMPARISON:  Head CT 12/31/2018  FINDINGS: BRAIN: There is a focus of magnetic susceptibility effect in the medial right temporal lobe, corresponding to a mildly hyperdense focus on the head CT. There is no associated contrast enhancement. There is a small amount of surrounding hyperintense T2-weighted signal. Multifocal white matter hyperintensity, most commonly due to chronic ischemic microangiopathy. The cerebral and cerebellar volume are age-appropriate. There is no hydrocephalus. The midline structures are normal.  VASCULAR: The major intracranial arterial and venous sinus flow voids are normal. Susceptibility-sensitive sequences show no chronic microhemorrhage or superficial siderosis.  SKULL AND UPPER  CERVICAL SPINE: Calvarial bone marrow signal is normal. There is no skull base mass. The visualized upper cervical spine and soft tissues are normal.  SINUSES/ORBITS: There are no fluid levels or advanced mucosal thickening. The mastoid air cells and middle ear cavities are free of fluid. The orbits are normal.  IMPRESSION: 1. Round area of magnetic susceptibility effect in the medial right temporal lobe, corresponding to a hyperdense focus on the earlier head CT. 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 There is a small amount of adjacent edema. This is a likely seizure etiology. 2. Chronic microvascular ischemia.  These results were called by telephone at the time of interpretation on 01/02/2019 at 1:50 am to Dr. Beather Arbour, Who verbally acknowledged these results.   Electronically Signed   By: Ulyses Jarred M.D.   On: 01/02/2019 01:50    I personally reviewed radiology studies with the patient   Impression/Plan:  Marcus Baldwin is here for evaluation of a right temporal lesion and first-time seizure.  We did review the imaging together and I do think that this is a likely cavernoma.  There is no contrast enhancement and minimal edema.  On the CT scan there is no sign of an acute hemorrhage and I do think he has had mostly subacute microhemorrhages.  We discussed the natural history of cavernomas and although I cannot prove this what this is at this time, that is the most likely etiology.  We discussed the first goal is to prevent any further seizures.  I do agree with neurology evaluation and starting an AED.  We discussed the treatment of cavernoma's can vary from surveillance to radiation to surgery.  I would like to see him in clinic in a few weeks ensuring that he has no further seizures before discussing any possible treatment.  No other imaging is needed at this time from the neurosurgery perspective.   1.  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  Plan -No urgent intervention, will see as an outpatient in  follow-up

## 2019-01-02 NOTE — Progress Notes (Signed)
[place] at [facility]                                                                                                                                                                                  Patient Demographics   Marcus Baldwin, is a 48 y.o. male, DOB - 10-Feb-1971, KD:[id number]  Admit date - 01/01/2019   Admitting Physician Harrie Foreman, MD  Outpatient Primary MD for the patient is Juline Patch, MD   LOS - 0  Subjective:  Patient had no further seizures was seen by neurology and neurosurgery   Review of Systems:   CONSTITUTIONAL: No documented fever. No fatigue, weakness. No weight gain, no weight loss.  EYES: No blurry or double vision.  ENT: No tinnitus. No postnasal drip. No redness of the oropharynx.  RESPIRATORY: No cough, no wheeze, no hemoptysis. No dyspnea.  CARDIOVASCULAR: No chest pain. No orthopnea. No palpitations. No syncope.  GASTROINTESTINAL: No nausea, no vomiting or diarrhea. No abdominal pain. No melena or hematochezia.  GENITOURINARY: No dysuria or hematuria.  ENDOCRINE: No polyuria or nocturia. No heat or cold intolerance.  HEMATOLOGY: No anemia. No bruising. No bleeding.  INTEGUMENTARY: No rashes. No lesions.  MUSCULOSKELETAL: No arthritis. No swelling. No gout.  NEUROLOGIC: No numbness, tingling, or ataxia. No seizure-type activity.  PSYCHIATRIC: No anxiety. No insomnia. No ADD.    Vitals:   Vitals:   01/02/19 0438 01/02/19 0447 01/02/19 0625 01/02/19 0900  BP: (!) 173/102  (!) 146/91 133/85  Pulse: 91  78 91  Resp: 16     Temp: 97.7 F (36.5 C)     TempSrc: Oral     SpO2: 100%     Weight:  105 kg    Height:  5\' 10"  (1.778 m)      Wt Readings from Last 3 Encounters:  01/02/19 105 kg  12/26/18 105.7 kg  12/07/18 105.7 kg     Intake/Output Summary (Last 24 hours) at 01/02/2019 1357 Last data filed at 01/02/2019 0900 Gross per 24 hour  Intake 0 ml  Output -  Net 0 ml    Physical Exam:    GENERAL: Pleasant-appearing in no apparent distress.  HEAD, EYES, EARS, NOSE AND THROAT: Atraumatic, normocephalic. Extraocular muscles are intact. Pupils equal and reactive to light. Sclerae anicteric. No conjunctival injection. No oro-pharyngeal erythema.  NECK: Supple. There is no jugular venous distention. No bruits, no lymphadenopathy, no thyromegaly.  HEART: Regular rate and rhythm,. No murmurs, no rubs, no clicks.  LUNGS: Clear to auscultation bilaterally. No rales or rhonchi. No wheezes.  ABDOMEN: Soft, flat, nontender, nondistended. Has good bowel sounds. No hepatosplenomegaly appreciated.  EXTREMITIES: No  evidence of any cyanosis, clubbing, or peripheral edema.  +2 pedal and radial pulses bilaterally.  NEUROLOGIC: The patient is alert, awake, and oriented x3 with no focal motor or sensory deficits appreciated bilaterally.  SKIN: Moist and warm with no rashes appreciated.  Psych: Not anxious, depressed LN: No inguinal LN enlargement    Antibiotics   Anti-infectives (From admission, onward)   None      Medications   Scheduled Meds: . docusate sodium  100 mg Oral BID  . insulin aspart  0-20 Units Subcutaneous TID WC  . insulin aspart  0-5 Units Subcutaneous QHS  . irbesartan  150 mg Oral Daily  . levETIRAcetam  500 mg Oral BID  . rosuvastatin  5 mg Oral Daily   Continuous Infusions: PRN Meds:.acetaminophen **OR** acetaminophen, labetalol, ondansetron **OR** ondansetron (ZOFRAN) IV   Data Review:   Micro Results No results found for this or any previous visit (from the past 240 hour(s)).  Radiology Reports Ct Head Wo Contrast  Result Date: 01/01/2019 CLINICAL DATA:  Seizure EXAM: CT HEAD WITHOUT CONTRAST TECHNIQUE: Contiguous axial images were obtained from the base of the skull through the vertex without intravenous contrast. COMPARISON:  None. FINDINGS: Brain: There is periventricular white matter hypoattenuation seen adjacent to the anterior horns of the lateral  ventricles. No extra-axial collections. The ventricles are normal in size and contour. Vascular: No hyperdense vessel or unexpected calcification. Skull: The skull is intact. No fracture or focal lesion identified. Sinuses/Orbits: The visualized paranasal sinuses and mastoid air cells are clear. The orbits and globes intact. Other: None IMPRESSION: Mild bifrontal periventricular deep white matter hypointensity which can be seen in the setting of small vessel ischemia, hypercoagulable state, vasculitis, migraines, prior infection or demyelination. If further evaluation is required, would recommend MRI. Electronically Signed   By: Prudencio Pair M.D.   On: 01/01/2019 22:19   Mr Jeri Cos And Wo Contrast  Result Date: 01/02/2019 CLINICAL DATA:  Possible seizure EXAM: MRI HEAD WITHOUT AND WITH CONTRAST TECHNIQUE: Multiplanar, multiecho pulse sequences of the brain and surrounding structures were obtained without and with intravenous contrast. CONTRAST:  9mL GADAVIST GADOBUTROL 1 MMOL/ML IV SOLN COMPARISON:  Head CT 12/31/2018 FINDINGS: BRAIN: There is a focus of magnetic susceptibility effect in the medial right temporal lobe, corresponding to a mildly hyperdense focus on the head CT. There is no associated contrast enhancement. There is a small amount of surrounding hyperintense T2-weighted signal. Multifocal white matter hyperintensity, most commonly due to chronic ischemic microangiopathy. The cerebral and cerebellar volume are age-appropriate. There is no hydrocephalus. The midline structures are normal. VASCULAR: The major intracranial arterial and venous sinus flow voids are normal. Susceptibility-sensitive sequences show no chronic microhemorrhage or superficial siderosis. SKULL AND UPPER CERVICAL SPINE: Calvarial bone marrow signal is normal. There is no skull base mass. The visualized upper cervical spine and soft tissues are normal. SINUSES/ORBITS: There are no fluid levels or advanced mucosal thickening. The  mastoid air cells and middle ear cavities are free of fluid. The orbits are normal. IMPRESSION: 1. Round area of magnetic susceptibility effect in the medial right temporal lobe, corresponding to a hyperdense focus on the earlier head CT. 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 There is a small amount of adjacent edema. This is a likely seizure etiology. 2. Chronic microvascular ischemia. These results were called by telephone at the time of interpretation on 01/02/2019 at 1:50 am to Dr. Beather Arbour, Who verbally acknowledged these results. Electronically Signed   By:  Ulyses Jarred M.D.   On: 01/02/2019 01:50     CBC Recent Labs  Lab 01/01/19 1935  WBC 6.5  HGB 13.3  HCT 37.7*  PLT 178  MCV 87.1  MCH 30.7  MCHC 35.3  RDW 12.3    Chemistries  Recent Labs  Lab 01/01/19 1935  NA 132*  K 3.7  CL 96*  CO2 25  GLUCOSE 239*  BUN 18  CREATININE 0.92  CALCIUM 9.1  AST 29  ALT 53*  ALKPHOS 41  BILITOT 0.8   ------------------------------------------------------------------------------------------------------------------ estimated creatinine clearance is 116.6 mL/min (by C-G formula based on SCr of 0.92 mg/dL). ------------------------------------------------------------------------------------------------------------------ Recent Labs    01/02/19 0537  HGBA1C 7.8*   ------------------------------------------------------------------------------------------------------------------ No results for input(s): CHOL, HDL, LDLCALC, TRIG, CHOLHDL, LDLDIRECT in the last 72 hours. ------------------------------------------------------------------------------------------------------------------ Recent Labs    01/02/19 0537  TSH 2.699   ------------------------------------------------------------------------------------------------------------------ No results for input(s): VITAMINB12, FOLATE, FERRITIN, TIBC, IRON, RETICCTPCT in the last 72  hours.  Coagulation profile No results for input(s): INR, PROTIME in the last 168 hours.  No results for input(s): DDIMER in the last 72 hours.  Cardiac Enzymes No results for input(s): CKMB, TROPONINI, MYOGLOBIN in the last 168 hours.  Invalid input(s): CK ------------------------------------------------------------------------------------------------------------------ Invalid input(s): [place]   This is a 48 year old male admitted for seizures. 1.  Seizure.  New onset; in setting of cavernoma.  Continue Keppra Seen by neurosurgery and neurology We will monitor overnight   2.  Elevated blood pressure:  Patient's blood pressure stable  3.  Obesity BMI is 33.2; encourage healthy diet and exercise      Code Status Orders  (From admission, onward)         Start     Ordered   01/02/19 0449  Full code  Continuous     01/02/19 0448        Code Status History    This patient has a current code status but no historical code status.   Advance Care Planning Activity           Consults neurology and neurosurgery  DVT Prophylaxis SCDs  Lab Results  Component Value Date   PLT 178 01/01/2019     Time Spent in minutes  62min Greater than 50% of time spent in care coordination and counseling patient regarding the condition and plan of care.   Dustin Flock M.D on 01/02/2019 at 1:57 PM  Between 7am to 6pm - Pager - [phone]  After 6pm go to [email or web address] - Proofreader  [employer]   Office  [phone]

## 2019-01-02 NOTE — ED Provider Notes (Signed)
-----------------------------------------   1:56 AM on 01/02/2019 -----------------------------------------  MRI brain discussed with Dr. Collins Scotland:  1. Round area of magnetic susceptibility effect in the medial right  temporal lobe, corresponding to a hyperdense focus on the earlier  head CT. This likely indicates a cavernous malformation (cavernoma)  with recent hemorrhage. There is a small amount of adjacent edema.  This is a likely seizure etiology.  2. Chronic microvascular ischemia.   Discussed case with Dr. Lacinda Axon from neurosurgery who recommends antiepileptic; no Decadron.  Will discuss with hospitalist Dr. Marcille Blanco to evaluate patient in the emergency department who will need neurology evaluation for seizure management.   Paulette Blanch, MD 01/02/19 [phone]

## 2019-01-02 NOTE — Plan of Care (Signed)
  Problem: Education: Goal: Expressions of having a comfortable level of knowledge regarding the disease process will increase Outcome: Progressing   Problem: Coping: Goal: Ability to adjust to condition or change in health will improve Outcome: Progressing Goal: Ability to identify appropriate support needs will improve Outcome: Progressing   Problem: Health Behavior/Discharge Planning: Goal: Compliance with prescribed medication regimen will improve Outcome: Progressing   Problem: Medication: Goal: Risk for medication side effects will decrease Outcome: Progressing   Problem: Clinical Measurements: Goal: Complications related to the disease process, condition or treatment will be avoided or minimized Outcome: Progressing Goal: Diagnostic test results will improve Outcome: Progressing   Problem: Clinical Measurements: Goal: Complications related to the disease process, condition or treatment will be avoided or minimized Outcome: Progressing Goal: Diagnostic test results will improve Outcome: Progressing   Problem: Self-Concept: Goal: Level of anxiety will decrease Outcome: Progressing Goal: Ability to verbalize feelings about condition will improve Outcome: Progressing   Problem: Education: Goal: Knowledge of General Education information will improve Description: Including pain rating scale, medication(s)/side effects and non-pharmacologic comfort measures Outcome: Progressing   Problem: Health Behavior/Discharge Planning: Goal: Ability to manage health-related needs will improve Outcome: Progressing   Problem: Clinical Measurements: Goal: Ability to maintain clinical measurements within normal limits will improve Outcome: Progressing Goal: Will remain free from infection Outcome: Progressing Goal: Diagnostic test results will improve Outcome: Progressing Goal: Respiratory complications will improve Outcome: Progressing Goal: Cardiovascular complication will be  avoided Outcome: Progressing   Problem: Activity: Goal: Risk for activity intolerance will decrease Outcome: Progressing   Problem: Nutrition: Goal: Adequate nutrition will be maintained Outcome: Progressing   Problem: Coping: Goal: Level of anxiety will decrease Outcome: Progressing   Problem: Elimination: Goal: Will not experience complications related to bowel motility Outcome: Progressing Goal: Will not experience complications related to urinary retention Outcome: Progressing   Problem: Pain Managment: Goal: General experience of comfort will improve Outcome: Progressing   Problem: Safety: Goal: Ability to remain free from injury will improve Outcome: Progressing   Problem: Skin Integrity: Goal: Risk for impaired skin integrity will decrease Outcome: Progressing

## 2019-01-02 NOTE — ED Notes (Signed)
Pt resting on stretcher with family at the bedside. Pt has no complaints of pain or dizziness at this time. Alert and talkative. No distress noted.

## 2019-01-02 NOTE — ED Notes (Signed)
Pt resting on stretcher with family at the bedside. Pt has no complaint of pain. No distress noted at this time. Awaiting MRI.

## 2019-01-02 NOTE — H&P (Signed)
Marcus Baldwin is an 48 y.o. male.   Chief Complaint: Seizure HPI: The patient with past medical history of prostatitis presents to the emergency department following a seizure.  Apparently he was sitting at the dinner table and stopped speaking midsentence which time he had a brief tonic-clonic seizure.  The patient does not recall seizure activity and was mildly postictal upon arrival but is now lucid.  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  Neurosurgery was contacted who advised close outpatient follow-up.  The patient was given Keppra in the emergency department and then admitted for observation of seizure control and neurology consultation.  Past Medical History:  Diagnosis Date  . Allergy   . Prostatitis     Past Surgical History:  Procedure Laterality Date  . BREAST SURGERY Left    lumpectomy  . CHOLECYSTECTOMY    . KNEE ARTHROSCOPY Left     Family History  Problem Relation Age of Onset  . Diabetes Father   . Cancer Maternal Uncle    Social History:  reports that he has never smoked. He has never used smokeless tobacco. He reports current alcohol use. He reports that he does not use drugs.  Allergies: No Known Allergies  Medications Prior to Admission  Medication Sig Dispense Refill  . Dulaglutide (TRULICITY) 1.5 0000000 SOPN Inject 1.5 mg into the skin every Sunday.     . metFORMIN (GLUCOPHAGE) 1000 MG tablet Take 1,000 mg by mouth 2 (two) times daily with a meal.     . olmesartan (BENICAR) 20 MG tablet Take 20 mg by mouth at bedtime.     . rosuvastatin (CRESTOR) 5 MG tablet Take 5 mg by mouth daily.    [name] ULTRA test strip       Results for orders placed or performed during the hospital encounter of 01/01/19 (from the past 48 hour(s))  Comprehensive metabolic panel     Status: Abnormal   Collection  Time: 01/01/19  7:35 PM  Result Value Ref Range   Sodium 132 (L) 135 - 145 mmol/L   Potassium 3.7 3.5 - 5.1 mmol/L   Chloride 96 (L) 98 - 111 mmol/L   CO2 25 22 - 32 mmol/L   Glucose, Bld 239 (H) 70 - 99 mg/dL   BUN 18 6 - 20 mg/dL   Creatinine, Ser 0.92 0.61 - 1.24 mg/dL   Calcium 9.1 8.9 - 10.3 mg/dL   Total Protein 7.3 6.5 - 8.1 g/dL   Albumin 3.9 3.5 - 5.0 g/dL   AST 29 15 - 41 U/L   ALT 53 (H) 0 - 44 U/L   Alkaline Phosphatase 41 38 - 126 U/L   Total Bilirubin 0.8 0.3 - 1.2 mg/dL   GFR calc non Af Amer >60 >60 mL/min   GFR calc Af Amer >60 >60 mL/min   Anion gap 11 5 - 15    Comment: Performed at [facility], [address] [place], [place] [address]  CBC     Status: Abnormal   Collection Time: 01/01/19  7:35 PM  Result Value Ref Range   WBC 6.5 4.0 - 10.5 K/uL   RBC 4.33 4.22 - 5.81 MIL/uL   Hemoglobin 13.3 13.0 - 17.0 g/dL   HCT 37.7 (L) 39.0 - 52.0 %   MCV 87.1 80.0 - 100.0 fL   MCH 30.7 26.0 -  34.0 pg   MCHC 35.3 30.0 - 36.0 g/dL   RDW 12.3 11.5 - 15.5 %   Platelets 178 150 - 400 K/uL   nRBC 0.0 0.0 - 0.2 %    Comment: Performed at [facility], [place], [place] [address]  Troponin I (High Sensitivity)     Status: None   Collection Time: 01/01/19  7:35 PM  Result Value Ref Range   Troponin I (High Sensitivity) 2 <18 ng/L    Comment: (NOTE) Elevated high sensitivity troponin I (hsTnI) values and significant  changes across serial measurements may suggest ACS but many other  chronic and acute conditions are known to elevate hsTnI results.  Refer to the "Links" section for chest pain algorithms and additional  guidance. Performed at [facility], [address] [place], [place] [address]   Glucose, capillary     Status: Abnormal   Collection Time: 01/01/19  7:38 PM  Result Value Ref Range   Glucose-Capillary 238 (H) 70 - 99 mg/dL  Urinalysis, Complete w Microscopic     Status: Abnormal   Collection Time:  01/01/19 10:42 PM  Result Value Ref Range   Color, Urine YELLOW (A) YELLOW   APPearance CLEAR (A) CLEAR   Specific Gravity, Urine 1.015 1.005 - 1.030   pH 7.0 5.0 - 8.0   Glucose, UA 150 (A) NEGATIVE mg/dL   Hgb urine dipstick NEGATIVE NEGATIVE   Bilirubin Urine NEGATIVE NEGATIVE   Ketones, ur NEGATIVE NEGATIVE mg/dL   Protein, ur NEGATIVE NEGATIVE mg/dL   Nitrite NEGATIVE NEGATIVE   Leukocytes,Ua NEGATIVE NEGATIVE   WBC, UA 0-5 0 - 5 WBC/hpf   Bacteria, UA NONE SEEN NONE SEEN   Squamous Epithelial / LPF NONE SEEN 0 - 5    Comment: Performed at [facility], [address] [place], [place] [address]  Urine Drug Screen, Qualitative     Status: None   Collection Time: 01/01/19 10:42 PM  Result Value Ref Range   Tricyclic, Ur Screen NONE DETECTED NONE DETECTED   Amphetamines, Ur Screen NONE DETECTED NONE DETECTED   MDMA (Ecstasy)Ur Screen NONE DETECTED NONE DETECTED   Cocaine Metabolite,Ur [place] NONE DETECTED NONE DETECTED   Opiate, Ur Screen NONE DETECTED NONE DETECTED   Phencyclidine (PCP) Ur S NONE DETECTED NONE DETECTED   Cannabinoid 50 Ng, Ur  NONE DETECTED NONE DETECTED   Barbiturates, Ur Screen NONE DETECTED NONE DETECTED   Benzodiazepine, Ur Scrn NONE DETECTED NONE DETECTED   Methadone Scn, Ur NONE DETECTED NONE DETECTED    Comment: (NOTE) Tricyclics + metabolites, urine    Cutoff 1000 ng/mL Amphetamines + metabolites, urine  Cutoff 1000 ng/mL MDMA (Ecstasy), urine              Cutoff 500 ng/mL Cocaine Metabolite, urine          Cutoff 300 ng/mL Opiate + metabolites, urine        Cutoff 300 ng/mL Phencyclidine (PCP), urine         Cutoff 25 ng/mL Cannabinoid, urine                 Cutoff 50 ng/mL Barbiturates + metabolites, urine  Cutoff 200 ng/mL Benzodiazepine, urine              Cutoff 200 ng/mL Methadone, urine                   Cutoff 300 ng/mL The urine drug screen provides only a preliminary, unconfirmed analytical  test result and should not be used for  non-medical purposes. Clinical consideration and professional judgment should be applied to any positive drug screen result due to possible interfering substances. A more specific alternate chemical method must be used in order to obtain a confirmed analytical result. Gas chromatography / mass spectrometry (GC/MS) is the preferred confirmat ory method. Performed at [facility], [address] [place], [place] [address]   TSH     Status: None   Collection Time: 01/02/19  5:37 AM  Result Value Ref Range   TSH 2.699 0.350 - 4.500 uIU/mL    Comment: Performed by a 3rd Generation assay with a functional sensitivity of <=0.01 uIU/mL. Performed at [facility], [place], [place] [address]    Ct Head Wo Contrast  Result Date: 01/01/2019 CLINICAL DATA:  Seizure EXAM: CT HEAD WITHOUT CONTRAST TECHNIQUE: Contiguous axial images were obtained from the base of the skull through the vertex without intravenous contrast. COMPARISON:  None. FINDINGS: Brain: There is periventricular white matter hypoattenuation seen adjacent to the anterior horns of the lateral ventricles. No extra-axial collections. The ventricles are normal in size and contour. Vascular: No hyperdense vessel or unexpected calcification. Skull: The skull is intact. No fracture or focal lesion identified. Sinuses/Orbits: The visualized paranasal sinuses and mastoid air cells are clear. The orbits and globes intact. Other: None IMPRESSION: Mild bifrontal periventricular deep white matter hypointensity which can be seen in the setting of small vessel ischemia, hypercoagulable state, vasculitis, migraines, prior infection or demyelination. If further evaluation is required, would recommend MRI. Electronically Signed   By: Prudencio Pair M.D.   On: 01/01/2019 22:19   Mr Jeri Cos And Wo Contrast  Result Date: 01/02/2019 CLINICAL DATA:  Possible seizure EXAM: MRI HEAD WITHOUT AND WITH CONTRAST TECHNIQUE:  Multiplanar, multiecho pulse sequences of the brain and surrounding structures were obtained without and with intravenous contrast. CONTRAST:  35mL GADAVIST GADOBUTROL 1 MMOL/ML IV SOLN COMPARISON:  Head CT 12/31/2018 FINDINGS: BRAIN: There is a focus of magnetic susceptibility effect in the medial right temporal lobe, corresponding to a mildly hyperdense focus on the head CT. There is no associated contrast enhancement. There is a small amount of surrounding hyperintense T2-weighted signal. Multifocal white matter hyperintensity, most commonly due to chronic ischemic microangiopathy. The cerebral and cerebellar volume are age-appropriate. There is no hydrocephalus. The midline structures are normal. VASCULAR: The major intracranial arterial and venous sinus flow voids are normal. Susceptibility-sensitive sequences show no chronic microhemorrhage or superficial siderosis. SKULL AND UPPER CERVICAL SPINE: Calvarial bone marrow signal is normal. There is no skull base mass. The visualized upper cervical spine and soft tissues are normal. SINUSES/ORBITS: There are no fluid levels or advanced mucosal thickening. The mastoid air cells and middle ear cavities are free of fluid. The orbits are normal. IMPRESSION: 1. Round area of magnetic susceptibility effect in the medial right temporal lobe, corresponding to a hyperdense focus on the earlier head CT. 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 There is a small amount of adjacent edema. This is a likely seizure etiology. 2. Chronic microvascular ischemia. These results were called by telephone at the time of interpretation on 01/02/2019 at 1:50 am to Dr. Beather Arbour, Who verbally acknowledged these results. Electronically Signed   By: Ulyses Jarred M.D.   On: 01/02/2019 01:50    Review of Systems  Constitutional: Negative for chills and fever.  HENT: Negative for sore throat and tinnitus.   Eyes: Negative  for blurred vision and  redness.  Respiratory: Negative for cough and shortness of breath.   Cardiovascular: Negative for chest pain, palpitations, orthopnea and PND.  Gastrointestinal: Negative for abdominal pain, diarrhea, nausea and vomiting.  Genitourinary: Negative for dysuria, frequency and urgency.  Musculoskeletal: Negative for joint pain and myalgias.  Skin: Negative for rash.       No lesions  Neurological: Positive for seizures. Negative for speech change, focal weakness and weakness.  Endo/Heme/Allergies: Does not bruise/bleed easily.       No temperature intolerance  Psychiatric/Behavioral: Negative for depression and suicidal ideas.    Blood pressure (!) 146/91, pulse 78, temperature 97.7 F (36.5 C), temperature source Oral, resp. rate 16, height 5\' 10"  (1.778 m), weight 105 kg, SpO2 100 %. Physical Exam  Vitals reviewed. Constitutional: He is oriented to person, place, and time. He appears well-developed and well-nourished. No distress.  HENT:  Head: Normocephalic and atraumatic.  Mouth/Throat: Oropharynx is clear and moist.  Eyes: Pupils are equal, round, and reactive to light. Conjunctivae and EOM are normal. No scleral icterus.  Neck: Normal range of motion. Neck supple. No JVD present. No tracheal deviation present. No thyromegaly present.  Cardiovascular: Normal rate and regular rhythm. Exam reveals friction rub. Exam reveals no gallop.  No murmur heard. Respiratory: Effort normal and breath sounds normal. No respiratory distress. He has no wheezes.  GI: Soft. Bowel sounds are normal. He exhibits no distension. There is no abdominal tenderness.  Genitourinary:    Genitourinary Comments: Deferred   Musculoskeletal: Normal range of motion.        General: No edema.  Lymphadenopathy:    He has no cervical adenopathy.  Neurological: He is alert and oriented to person, place, and time. No cranial nerve deficit.  Skin: Skin is warm and dry. No rash noted. No erythema.  Psychiatric: He has  a normal mood and affect. His behavior is normal. Judgment and thought content normal.     Assessment/Plan This is a 48 year old male admitted for seizures. 1.  Seizure.  New onset; loaded with Keppra in the emergency department.  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  Neurosurgery to see the patient immediately following discharge.  Neurology to consult regarding antiepileptic medication.  Seizure precautions while hospitalized. 2.  Elevated blood pressure: Not consistent with [place]'s response however I do not want to lower the patient's blood pressure dramatically despite his intracranial hemorrhage as this appears to be secondary to the brain mass. 3.  Obesity BMI is 33.2; encourage healthy diet and exercise 4.  DVT prophylaxis: Early ambulation 5.  GI prophylaxis: None The patient is a full code.  Time spent on admission orders and patient care approximately 45 minutes  Harrie Foreman, MD 01/02/2019, 7:36 AM

## 2019-01-02 NOTE — ED Notes (Signed)
ED TO INPATIENT HANDOFF REPORT  ED Nurse Name and Phone #: Aleric Froelich [phone]  S Name/Age/Gender Marcus Baldwin 48 y.o. male Room/Bed: ED25A/ED25A  Code Status   Code Status: Not on file  Home/SNF/Other Home Patient oriented to: self, place, time and situation Is this baseline? Yes   Triage Complete: Triage complete  Chief Complaint AMS  Triage Note PT to ED reporting he was eating dinner this evening and talking to his wife when his wife states he stopped talking mid sentence and his eyes rolled back in his head. Pt started "twitching" and was not responding to wife or daughter. Pt has no memory of the event and denies symptoms at this time other than mild left sided chest pain.  NO neuro deficits noted at this time.   Pt also reports he last remembers feeling "flushed" and nauseous    Allergies No Known Allergies  Level of Care/Admitting Diagnosis ED Disposition    ED Disposition Condition [facility] Area: [place] [[id number]]  Level of Care: Med-Surg [16]  Covid Evaluation: Asymptomatic Screening Protocol (No Symptoms)  Diagnosis: Seizure [facility]) [id number]  Admitting Physician: Harrie Foreman [id number]  Attending Physician: Harrie Foreman [phone]  PT Class (Do Not Modify): Observation [104]  PT Acc Code (Do Not Modify): Observation [10022]       B Medical/Surgery History Past Medical History:  Diagnosis Date  . Allergy   . Prostatitis    Past Surgical History:  Procedure Laterality Date  . BREAST SURGERY Left    lumpectomy  . CHOLECYSTECTOMY    . KNEE ARTHROSCOPY Left      A IV Location/Drains/Wounds Patient Lines/Drains/Airways Status   Active Line/Drains/Airways    Name:   Placement date:   Placement time:   Site:   Days:   Peripheral IV 01/01/19 Right Hand   01/01/19    2158    Hand   1          Intake/Output Last 24 hours No intake or output data in the 24 hours ending 01/02/19  [id number]  Labs/Imaging Results for orders placed or performed during the hospital encounter of 01/01/19 (from the past 48 hour(s))  Comprehensive metabolic panel     Status: Abnormal   Collection Time: 01/01/19  7:35 PM  Result Value Ref Range   Sodium 132 (L) 135 - 145 mmol/L   Potassium 3.7 3.5 - 5.1 mmol/L   Chloride 96 (L) 98 - 111 mmol/L   CO2 25 22 - 32 mmol/L   Glucose, Bld 239 (H) 70 - 99 mg/dL   BUN 18 6 - 20 mg/dL   Creatinine, Ser 0.92 0.61 - 1.24 mg/dL   Calcium 9.1 8.9 - 10.3 mg/dL   Total Protein 7.3 6.5 - 8.1 g/dL   Albumin 3.9 3.5 - 5.0 g/dL   AST 29 15 - 41 U/L   ALT 53 (H) 0 - 44 U/L   Alkaline Phosphatase 41 38 - 126 U/L   Total Bilirubin 0.8 0.3 - 1.2 mg/dL   GFR calc non Af Amer >60 >60 mL/min   GFR calc Af Amer >60 >60 mL/min   Anion gap 11 5 - 15    Comment: Performed at [facility], [address] [place], [place] [address]  CBC     Status: Abnormal   Collection Time: 01/01/19  7:35 PM  Result Value Ref Range   WBC 6.5 4.0 - 10.5 K/uL   RBC 4.33 4.22 -  5.81 MIL/uL   Hemoglobin 13.3 13.0 - 17.0 g/dL   HCT 37.7 (L) 39.0 - 52.0 %   MCV 87.1 80.0 - 100.0 fL   MCH 30.7 26.0 - 34.0 pg   MCHC 35.3 30.0 - 36.0 g/dL   RDW 12.3 11.5 - 15.5 %   Platelets 178 150 - 400 K/uL   nRBC 0.0 0.0 - 0.2 %    Comment: Performed at [facility], [place], [place] [address]  Troponin I (High Sensitivity)     Status: None   Collection Time: 01/01/19  7:35 PM  Result Value Ref Range   Troponin I (High Sensitivity) 2 <18 ng/L    Comment: (NOTE) Elevated high sensitivity troponin I (hsTnI) values and significant  changes across serial measurements may suggest ACS but many other  chronic and acute conditions are known to elevate hsTnI results.  Refer to the "Links" section for chest pain algorithms and additional  guidance. Performed at [facility], [address] [place], [place] [address]   Glucose, capillary     Status:  Abnormal   Collection Time: 01/01/19  7:38 PM  Result Value Ref Range   Glucose-Capillary 238 (H) 70 - 99 mg/dL  Urinalysis, Complete w Microscopic     Status: Abnormal   Collection Time: 01/01/19 10:42 PM  Result Value Ref Range   Color, Urine YELLOW (A) YELLOW   APPearance CLEAR (A) CLEAR   Specific Gravity, Urine 1.015 1.005 - 1.030   pH 7.0 5.0 - 8.0   Glucose, UA 150 (A) NEGATIVE mg/dL   Hgb urine dipstick NEGATIVE NEGATIVE   Bilirubin Urine NEGATIVE NEGATIVE   Ketones, ur NEGATIVE NEGATIVE mg/dL   Protein, ur NEGATIVE NEGATIVE mg/dL   Nitrite NEGATIVE NEGATIVE   Leukocytes,Ua NEGATIVE NEGATIVE   WBC, UA 0-5 0 - 5 WBC/hpf   Bacteria, UA NONE SEEN NONE SEEN   Squamous Epithelial / LPF NONE SEEN 0 - 5    Comment: Performed at [facility], [address] [place], [place] [address]  Urine Drug Screen, Qualitative     Status: None   Collection Time: 01/01/19 10:42 PM  Result Value Ref Range   Tricyclic, Ur Screen NONE DETECTED NONE DETECTED   Amphetamines, Ur Screen NONE DETECTED NONE DETECTED   MDMA (Ecstasy)Ur Screen NONE DETECTED NONE DETECTED   Cocaine Metabolite,Ur [place] NONE DETECTED NONE DETECTED   Opiate, Ur Screen NONE DETECTED NONE DETECTED   Phencyclidine (PCP) Ur S NONE DETECTED NONE DETECTED   Cannabinoid 50 Ng, Ur [place] NONE DETECTED NONE DETECTED   Barbiturates, Ur Screen NONE DETECTED NONE DETECTED   Benzodiazepine, Ur Scrn NONE DETECTED NONE DETECTED   Methadone Scn, Ur NONE DETECTED NONE DETECTED    Comment: (NOTE) Tricyclics + metabolites, urine    Cutoff 1000 ng/mL Amphetamines + metabolites, urine  Cutoff 1000 ng/mL MDMA (Ecstasy), urine              Cutoff 500 ng/mL Cocaine Metabolite, urine          Cutoff 300 ng/mL Opiate + metabolites, urine        Cutoff 300 ng/mL Phencyclidine (PCP), urine         Cutoff 25 ng/mL Cannabinoid, urine                 Cutoff 50 ng/mL Barbiturates + metabolites, urine  Cutoff 200 ng/mL Benzodiazepine, urine               Cutoff 200  ng/mL Methadone, urine                   Cutoff 300 ng/mL The urine drug screen provides only a preliminary, unconfirmed analytical test result and should not be used for non-medical purposes. Clinical consideration and professional judgment should be applied to any positive drug screen result due to possible interfering substances. A more specific alternate chemical method must be used in order to obtain a confirmed analytical result. Gas chromatography / mass spectrometry (GC/MS) is the preferred confirmat ory method. Performed at [facility], [place], [place] [address]    Ct Head Wo Contrast  Result Date: 01/01/2019 CLINICAL DATA:  Seizure EXAM: CT HEAD WITHOUT CONTRAST TECHNIQUE: Contiguous axial images were obtained from the base of the skull through the vertex without intravenous contrast. COMPARISON:  None. FINDINGS: Brain: There is periventricular white matter hypoattenuation seen adjacent to the anterior horns of the lateral ventricles. No extra-axial collections. The ventricles are normal in size and contour. Vascular: No hyperdense vessel or unexpected calcification. Skull: The skull is intact. No fracture or focal lesion identified. Sinuses/Orbits: The visualized paranasal sinuses and mastoid air cells are clear. The orbits and globes intact. Other: None IMPRESSION: Mild bifrontal periventricular deep white matter hypointensity which can be seen in the setting of small vessel ischemia, hypercoagulable state, vasculitis, migraines, prior infection or demyelination. If further evaluation is required, would recommend MRI. Electronically Signed   By: Prudencio Pair M.D.   On: 01/01/2019 22:19   Mr Jeri Cos And Wo Contrast  Result Date: 01/02/2019 CLINICAL DATA:  Possible seizure EXAM: MRI HEAD WITHOUT AND WITH CONTRAST TECHNIQUE: Multiplanar, multiecho pulse sequences of the brain and surrounding structures were obtained without and with  intravenous contrast. CONTRAST:  28mL GADAVIST GADOBUTROL 1 MMOL/ML IV SOLN COMPARISON:  Head CT 12/31/2018 FINDINGS: BRAIN: There is a focus of magnetic susceptibility effect in the medial right temporal lobe, corresponding to a mildly hyperdense focus on the head CT. There is no associated contrast enhancement. There is a small amount of surrounding hyperintense T2-weighted signal. Multifocal white matter hyperintensity, most commonly due to chronic ischemic microangiopathy. The cerebral and cerebellar volume are age-appropriate. There is no hydrocephalus. The midline structures are normal. VASCULAR: The major intracranial arterial and venous sinus flow voids are normal. Susceptibility-sensitive sequences show no chronic microhemorrhage or superficial siderosis. SKULL AND UPPER CERVICAL SPINE: Calvarial bone marrow signal is normal. There is no skull base mass. The visualized upper cervical spine and soft tissues are normal. SINUSES/ORBITS: There are no fluid levels or advanced mucosal thickening. The mastoid air cells and middle ear cavities are free of fluid. The orbits are normal. IMPRESSION: 1. Round area of magnetic susceptibility effect in the medial right temporal lobe, corresponding to a hyperdense focus on the earlier head CT. 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 There is a small amount of adjacent edema. This is a likely seizure etiology. 2. Chronic microvascular ischemia. These results were called by telephone at the time of interpretation on 01/02/2019 at 1:50 am to Dr. Beather Arbour, Who verbally acknowledged these results. Electronically Signed   By: Ulyses Jarred M.D.   On: 01/02/2019 01:50    Pending Labs Unresulted Labs (From admission, onward)    Start     Ordered   01/02/19 0226  SARS CORONAVIRUS 2 (TAT 6-24 HRS) Nasopharyngeal Nasopharyngeal Swab  (Asymptomatic/Tier 2 Patients Labs)  Once,   STAT    Question Answer Comment  Is  this test for diagnosis  or screening Screening   Symptomatic for COVID-19 as defined by CDC No   Hospitalized for COVID-19 No   Admitted to ICU for COVID-19 No   Previously tested for COVID-19 No   Resident in a congregate (group) care setting No   Employed in healthcare setting No      01/02/19 0225   Signed and Held  HIV Antibody (routine testing w rflx)  (HIV Antibody (Routine testing w reflex) panel)  Once,   R     Signed and Held   Signed and Held  HIV4GL Save Tube  (HIV Antibody (Routine testing w reflex) panel)  Once,   R     Signed and Held   Signed and Held  Hemoglobin A1c  Once,   R    Comments: To assess prior glycemic control    Signed and Held   Signed and Held  TSH  Add-on,   R     Signed and Held          Vitals/Pain Today's Vitals   01/01/19 2235 01/01/19 2330 01/02/19 0130 01/02/19 0309  BP:  (!) 166/98 (!) 161/97 (!) 161/117  Pulse:  86 85 88  Resp:  18 18 20   Temp:      TempSrc:      SpO2:  100% 99% 99%  Weight:      Height:      PainSc: 0-No pain 0-No pain 0-No pain 0-No pain    Isolation Precautions No active isolations  Medications Medications  sodium chloride 0.9 % bolus 1,000 mL (0 mLs Intravenous Stopped 01/01/19 2355)  gadobutrol (GADAVIST) 1 MMOL/ML injection 10 mL (10 mLs Intravenous Contrast Given 01/02/19 0122)  levETIRAcetam (KEPPRA) IVPB 1500 mg/ 100 mL premix (1,500 mg Intravenous New Bag/Given 01/02/19 0311)    Mobility walks Low fall risk   Focused Assessments Cardiac Assessment Handoff:    No results found for: CKTOTAL, CKMB, CKMBINDEX, TROPONINI No results found for: DDIMER Does the Patient currently have chest pain? No      R Recommendations: See Admitting Provider Note  Report given to:   Additional Notes: n/a

## 2019-01-03 DIAGNOSIS — E669 Obesity, unspecified: Secondary | ICD-10-CM | POA: Diagnosis not present

## 2019-01-03 DIAGNOSIS — R569 Unspecified convulsions: Secondary | ICD-10-CM | POA: Diagnosis not present

## 2019-01-03 DIAGNOSIS — I629 Nontraumatic intracranial hemorrhage, unspecified: Secondary | ICD-10-CM | POA: Diagnosis not present

## 2019-01-03 DIAGNOSIS — I10 Essential (primary) hypertension: Secondary | ICD-10-CM | POA: Diagnosis not present

## 2019-01-03 LAB — GLUCOSE, CAPILLARY: Glucose-Capillary: 181 mg/dL — ABNORMAL HIGH (ref 70–99)

## 2019-01-03 MED ORDER — LEVETIRACETAM 500 MG PO TABS: 500.0000 mg | ORAL_TABLET | Freq: Two times a day (BID) | ORAL | 0 refills | Status: AC

## 2019-01-03 NOTE — Discharge Summary (Signed)
[place] at [facility], 48 y.o., DOB [birth year], MRN [id number]. 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  Patient had a MRI of the brain that showed cavernoma.  Patient was seen by neurosurgery who who recommended close follow-up.  Patient was also started on seizure medication per neurology. Pt had no further seizure. PT stable for discharge.            Consults  neurology  Significant Tests:  See full reports for all details    Ct Head Wo Contrast  Result Date: 01/01/2019 CLINICAL DATA:  Seizure EXAM: CT HEAD WITHOUT CONTRAST TECHNIQUE: Contiguous axial images were obtained from the base of the skull through the vertex without intravenous contrast. COMPARISON:  None. FINDINGS: Brain: There is periventricular white matter hypoattenuation seen adjacent to the anterior horns of the lateral ventricles. No extra-axial collections. The ventricles are normal in size and contour. Vascular: No hyperdense vessel or unexpected calcification. Skull: The skull is intact. No fracture or focal lesion identified. Sinuses/Orbits: The visualized paranasal sinuses and mastoid air cells are clear. The orbits and globes intact. Other: None IMPRESSION: Mild bifrontal periventricular deep white matter hypointensity which can be seen in the setting of small vessel ischemia, hypercoagulable state, vasculitis, migraines, prior infection or demyelination. If further evaluation is required, would recommend MRI. Electronically Signed   By: Prudencio Pair M.D.   On: 01/01/2019 22:19   Mr Jeri Cos  And Wo Contrast  Result Date: 01/02/2019 CLINICAL DATA:  Possible seizure EXAM: MRI HEAD WITHOUT AND WITH CONTRAST TECHNIQUE: Multiplanar, multiecho pulse sequences of the brain and surrounding structures were obtained without and with intravenous contrast. CONTRAST:  74mL GADAVIST GADOBUTROL 1 MMOL/ML IV SOLN COMPARISON:  Head CT 12/31/2018 FINDINGS: BRAIN: There is a focus of magnetic susceptibility effect in the medial right temporal lobe, corresponding to a mildly hyperdense focus on the head CT. There is no associated contrast enhancement. There is a small amount of surrounding hyperintense T2-weighted signal. Multifocal white matter hyperintensity, most commonly due to chronic ischemic microangiopathy. The cerebral and cerebellar volume are age-appropriate. There is no hydrocephalus. The midline structures are normal. VASCULAR: The major intracranial arterial and venous sinus flow voids are normal. Susceptibility-sensitive sequences show no chronic microhemorrhage or superficial siderosis. SKULL AND UPPER CERVICAL SPINE: Calvarial bone marrow signal is normal. There is no skull base mass. The visualized upper cervical spine and soft tissues are normal. SINUSES/ORBITS: There are no fluid levels or advanced mucosal thickening. The mastoid air cells and middle ear cavities are free of fluid. The orbits are normal. IMPRESSION: 1. Round area of magnetic susceptibility effect in the medial right temporal lobe, corresponding to a hyperdense focus on the earlier head CT. 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 There is a small amount of adjacent edema. This is a likely seizure etiology. 2. Chronic microvascular ischemia. These results were called by telephone at the time of interpretation on 01/02/2019 at 1:50 am to Dr. Beather Arbour, Who verbally acknowledged these results. Electronically Signed   By: Ulyses Jarred M.D.   On: 01/02/2019 01:50       Today   Subjective:    Marcus John  Baldwin   Pt denties  Any symtoms  Objective:   Blood pressure (!) 151/98, pulse (!) 102, temperature 98.3 F (36.8 C), temperature source Oral, resp. rate 18, height 5\' 10"  (1.778 m), weight 103.6 kg, SpO2 98 %.  .  Intake/Output Summary (Last 24 hours) at 01/03/2019 1543 Last data filed at 01/03/2019 0900 Gross per 24 hour  Intake 720 ml  Output -  Net 720 ml    Exam VITAL SIGNS: Blood pressure (!) 151/98, pulse (!) 102, temperature 98.3 F (36.8 C), temperature source Oral, resp. rate 18, height 5\' 10"  (1.778 m), weight 103.6 kg, SpO2 98 %.  GENERAL:  48 y.o.-year-old patient lying in the bed with no acute distress.  EYES: Pupils equal, round, reactive to light and accommodation. No scleral icterus. Extraocular muscles intact.  HEENT: Head atraumatic, normocephalic. Oropharynx and nasopharynx clear.  NECK:  Supple, no jugular venous distention. No thyroid enlargement, no tenderness.  LUNGS: Normal breath sounds bilaterally, no wheezing, rales,rhonchi or crepitation. No use of accessory muscles of respiration.  CARDIOVASCULAR: S1, S2 normal. No murmurs, rubs, or gallops.  ABDOMEN: Soft, nontender, nondistended. Bowel sounds present. No organomegaly or mass.  EXTREMITIES: No pedal edema, cyanosis, or clubbing.  NEUROLOGIC: Cranial nerves II through XII are intact. Muscle strength 5/5 in all extremities. Sensation intact. Gait not checked.  PSYCHIATRIC: The patient is alert and oriented x 3.  SKIN: No obvious rash, lesion, or ulcer.   Data Review     CBC w Diff:  Lab Results  Component Value Date   WBC 6.5 01/01/2019   HGB 13.3 01/01/2019   HGB 15.9 08/20/2013   HCT 37.7 (L) 01/01/2019   HCT 45.7 08/20/2013   PLT 178 01/01/2019   PLT 147 (L) 08/20/2013   LYMPHOPCT 8.2 08/20/2013   MONOPCT 7.9 08/20/2013   EOSPCT 0.0 08/20/2013   BASOPCT 0.3 08/20/2013   CMP:  Lab Results  Component Value Date   NA 132 (L) 01/01/2019   NA 139 02/06/2016   NA 127 (L)  08/20/2013   K 3.7 01/01/2019   K 3.8 08/20/2013   CL 96 (L) 01/01/2019   CL 90 (L) 08/20/2013   CO2 25 01/01/2019   CO2 27 08/20/2013   BUN 18 01/01/2019   BUN 15 02/06/2016   BUN 11 08/20/2013   CREATININE 0.92 01/01/2019   CREATININE 1.06 08/20/2013   PROT 7.3 01/01/2019   PROT 8.1 03/17/2012   ALBUMIN 3.9 01/01/2019   ALBUMIN 4.5 02/06/2016   ALBUMIN 3.5 03/17/2012   BILITOT 0.8 01/01/2019   BILITOT 0.4 03/17/2012   ALKPHOS 41 01/01/2019   ALKPHOS 71 03/17/2012   AST 29 01/01/2019   AST 24 03/17/2012   ALT 53 (H) 01/01/2019   ALT 44 03/17/2012  .  Micro Results Recent Results (from the past 240 hour(s))  SARS CORONAVIRUS 2 (TAT 6-24 HRS) Nasopharyngeal Nasopharyngeal Swab     Status: None   Collection Time: 01/02/19  3:17 AM   Specimen: Nasopharyngeal Swab  Result Value Ref Range Status   SARS Coronavirus 2 NEGATIVE NEGATIVE Final    Comment: (NOTE) SARS-CoV-2 target nucleic acids are NOT DETECTED. The SARS-CoV-2 RNA is generally detectable in upper and lower respiratory specimens during the acute phase of infection. Negative results do not preclude SARS-CoV-2 infection, do not rule out co-infections with other pathogens, and should not be used as the sole basis for treatment or other patient management decisions. Negative results must be combined with clinical observations, patient history, and epidemiological  information. The expected result is Negative. Fact Sheet for Patients: SugarRoll.be Fact Sheet for Healthcare Providers: https://www.woods-mathews.com/ This test is not yet approved or cleared by the [place] FDA and  has been authorized for detection and/or diagnosis of SARS-CoV-2 by FDA under an Emergency Use Authorization (EUA). This EUA will remain  in effect (meaning this test can be used) for the duration of the COVID-19 declaration under Section 56 4(b)(1) of the Act, 21 U.S.C. section 360bbb-3(b)(1),  unless the authorization is terminated or revoked sooner. Performed at [facility], [place] [address], [place], [place] [address]         Code Status Orders  (From admission, onward)         Start     Ordered   01/02/19 0449  Full code  Continuous     01/02/19 0448        Code Status History    This patient has a current code status but no historical code status.   Advance Care Planning Activity          Follow-up Information    Anabel Bene, MD.   Specialty: Neurology Why: for follow up with neurology Contact information: [address] [facility] [facility] [place] [address] [phone]        Juline Patch, MD In 6 days.   Specialty: Family Medicine Contact information: [address] [place] [address] [phone]        Deetta Perla, MD.   Specialty: Neurosurgery Why: pt has already this scheduled Contact information: [place]  [address] [phone]           Discharge Medications   Allergies as of 01/03/2019   No Known Allergies     Medication List    TAKE these medications   levETIRAcetam 500 MG tablet Commonly known as: KEPPRA Take 1 tablet (500 mg total) by mouth 2 (two) times daily.   metFORMIN 1000 MG tablet Commonly known as: GLUCOPHAGE Take 1,000 mg by mouth 2 (two) times daily with a meal.   olmesartan 20 MG tablet Commonly known as: BENICAR Take 20 mg by mouth at bedtime.   OneTouch Ultra test strip Generic drug: glucose blood   rosuvastatin 5 MG tablet Commonly known as: CRESTOR Take 5 mg by mouth daily.   Trulicity 1.5 0000000 Sopn Generic drug: Dulaglutide Inject 1.5 mg into the skin every Sunday.          Total Time in preparing paper work, data evaluation and todays exam - 52 minutes  Dustin Flock M.D on 01/03/2019 at 3:43 PM [place]  [phone]

## 2019-01-03 NOTE — Progress Notes (Signed)
Discharge instructions given and went over with patient at bedside. All questions answered. Patient discharged home with wife. Frieda Arnall S, RN  

## 2019-01-08 ENCOUNTER — Ambulatory Visit: Payer: BC Managed Care – PPO | Admitting: Urology

## 2019-01-08 DIAGNOSIS — R569 Unspecified convulsions: Secondary | ICD-10-CM | POA: Diagnosis not present

## 2019-01-08 DIAGNOSIS — E113293 Type 2 diabetes mellitus with mild nonproliferative diabetic retinopathy without macular edema, bilateral: Secondary | ICD-10-CM | POA: Diagnosis not present

## 2019-01-16 DIAGNOSIS — Q283 Other malformations of cerebral vessels: Secondary | ICD-10-CM | POA: Diagnosis not present

## 2019-02-02 DIAGNOSIS — R569 Unspecified convulsions: Secondary | ICD-10-CM | POA: Diagnosis not present

## 2019-05-09 DIAGNOSIS — Q283 Other malformations of cerebral vessels: Secondary | ICD-10-CM | POA: Diagnosis not present

## 2019-05-09 DIAGNOSIS — Z01818 Encounter for other preprocedural examination: Secondary | ICD-10-CM | POA: Diagnosis not present

## 2019-05-28 DIAGNOSIS — Z01818 Encounter for other preprocedural examination: Secondary | ICD-10-CM | POA: Diagnosis not present

## 2019-05-30 DIAGNOSIS — R220 Localized swelling, mass and lump, head: Secondary | ICD-10-CM | POA: Diagnosis not present

## 2019-05-30 DIAGNOSIS — Z9889 Other specified postprocedural states: Secondary | ICD-10-CM | POA: Diagnosis not present

## 2019-05-30 DIAGNOSIS — E1165 Type 2 diabetes mellitus with hyperglycemia: Secondary | ICD-10-CM | POA: Diagnosis not present

## 2019-05-30 DIAGNOSIS — Z20822 Contact with and (suspected) exposure to covid-19: Secondary | ICD-10-CM | POA: Diagnosis not present

## 2019-05-30 DIAGNOSIS — Z7984 Long term (current) use of oral hypoglycemic drugs: Secondary | ICD-10-CM | POA: Diagnosis not present

## 2019-05-30 DIAGNOSIS — D1802 Hemangioma of intracranial structures: Secondary | ICD-10-CM | POA: Diagnosis not present

## 2019-05-30 DIAGNOSIS — Z87442 Personal history of urinary calculi: Secondary | ICD-10-CM | POA: Diagnosis not present

## 2019-05-30 DIAGNOSIS — Z9049 Acquired absence of other specified parts of digestive tract: Secondary | ICD-10-CM | POA: Diagnosis not present

## 2019-05-30 DIAGNOSIS — R569 Unspecified convulsions: Secondary | ICD-10-CM | POA: Diagnosis not present

## 2019-05-30 DIAGNOSIS — Z79891 Long term (current) use of opiate analgesic: Secondary | ICD-10-CM | POA: Diagnosis not present

## 2019-05-30 DIAGNOSIS — Z8619 Personal history of other infectious and parasitic diseases: Secondary | ICD-10-CM | POA: Diagnosis not present

## 2019-05-30 DIAGNOSIS — Z79899 Other long term (current) drug therapy: Secondary | ICD-10-CM | POA: Diagnosis not present

## 2019-05-30 DIAGNOSIS — E11319 Type 2 diabetes mellitus with unspecified diabetic retinopathy without macular edema: Secondary | ICD-10-CM | POA: Diagnosis not present

## 2019-05-30 DIAGNOSIS — I6789 Other cerebrovascular disease: Secondary | ICD-10-CM | POA: Diagnosis not present

## 2019-05-30 DIAGNOSIS — D496 Neoplasm of unspecified behavior of brain: Secondary | ICD-10-CM | POA: Diagnosis not present

## 2019-05-30 DIAGNOSIS — I10 Essential (primary) hypertension: Secondary | ICD-10-CM | POA: Diagnosis not present

## 2019-05-30 DIAGNOSIS — G9389 Other specified disorders of brain: Secondary | ICD-10-CM | POA: Diagnosis not present

## 2019-05-30 DIAGNOSIS — Q283 Other malformations of cerebral vessels: Secondary | ICD-10-CM | POA: Diagnosis not present

## 2019-05-30 DIAGNOSIS — D1809 Hemangioma of other sites: Secondary | ICD-10-CM | POA: Diagnosis not present

## 2019-05-30 DIAGNOSIS — M1990 Unspecified osteoarthritis, unspecified site: Secondary | ICD-10-CM | POA: Diagnosis not present

## 2019-05-30 DIAGNOSIS — E785 Hyperlipidemia, unspecified: Secondary | ICD-10-CM | POA: Diagnosis not present

## 2019-05-31 DIAGNOSIS — D1809 Hemangioma of other sites: Secondary | ICD-10-CM | POA: Diagnosis not present

## 2019-05-31 DIAGNOSIS — Z9889 Other specified postprocedural states: Secondary | ICD-10-CM | POA: Diagnosis not present

## 2019-05-31 DIAGNOSIS — R220 Localized swelling, mass and lump, head: Secondary | ICD-10-CM | POA: Diagnosis not present

## 2019-05-31 DIAGNOSIS — Q283 Other malformations of cerebral vessels: Secondary | ICD-10-CM | POA: Diagnosis not present

## 2019-05-31 DIAGNOSIS — E1165 Type 2 diabetes mellitus with hyperglycemia: Secondary | ICD-10-CM | POA: Diagnosis not present

## 2019-05-31 DIAGNOSIS — R569 Unspecified convulsions: Secondary | ICD-10-CM | POA: Diagnosis not present

## 2019-05-31 DIAGNOSIS — G9389 Other specified disorders of brain: Secondary | ICD-10-CM | POA: Diagnosis not present

## 2019-05-31 DIAGNOSIS — I6789 Other cerebrovascular disease: Secondary | ICD-10-CM | POA: Diagnosis not present

## 2019-05-31 DIAGNOSIS — I10 Essential (primary) hypertension: Secondary | ICD-10-CM | POA: Diagnosis not present

## 2019-05-31 DIAGNOSIS — D496 Neoplasm of unspecified behavior of brain: Secondary | ICD-10-CM | POA: Diagnosis not present

## 2019-06-02 DIAGNOSIS — E1165 Type 2 diabetes mellitus with hyperglycemia: Secondary | ICD-10-CM | POA: Diagnosis not present

## 2019-06-04 ENCOUNTER — Ambulatory Visit: Payer: BC Managed Care – PPO | Admitting: Family Medicine

## 2019-06-04 ENCOUNTER — Other Ambulatory Visit
Admission: RE | Admit: 2019-06-04 | Discharge: 2019-06-04 | Disposition: A | Discharge status: Home / Self Care | Payer: BC Managed Care – PPO | Attending: Family Medicine | Admitting: Family Medicine

## 2019-06-04 ENCOUNTER — Other Ambulatory Visit: Payer: Self-pay

## 2019-06-04 ENCOUNTER — Encounter: Payer: Self-pay | Admitting: Family Medicine

## 2019-06-04 VITALS — BP 126/80 | HR 120 | Temp 100.5°F | Resp 12 | Ht 70.0 in | Wt 232.0 lb

## 2019-06-04 DIAGNOSIS — R030 Elevated blood-pressure reading, without diagnosis of hypertension: Secondary | ICD-10-CM

## 2019-06-04 DIAGNOSIS — R509 Fever, unspecified: Secondary | ICD-10-CM | POA: Insufficient documentation

## 2019-06-04 DIAGNOSIS — S0083XD Contusion of other part of head, subsequent encounter: Secondary | ICD-10-CM | POA: Diagnosis not present

## 2019-06-04 DIAGNOSIS — R519 Headache, unspecified: Secondary | ICD-10-CM | POA: Diagnosis not present

## 2019-06-04 DIAGNOSIS — Z9889 Other specified postprocedural states: Secondary | ICD-10-CM | POA: Insufficient documentation

## 2019-06-04 DIAGNOSIS — J9811 Atelectasis: Secondary | ICD-10-CM | POA: Diagnosis not present

## 2019-06-04 LAB — CBC WITH DIFFERENTIAL/PLATELET
Abs Immature Granulocytes: 0.03 10*3/uL (ref 0.00–0.07)
Basophils Absolute: 0 10*3/uL (ref 0.0–0.1)
Basophils Relative: 0 %
Eosinophils Absolute: 0.1 10*3/uL (ref 0.0–0.5)
Eosinophils Relative: 1 %
HCT: 42.6 % (ref 39.0–52.0)
Hemoglobin: 14.9 g/dL (ref 13.0–17.0)
Immature Granulocytes: 0 %
Lymphocytes Relative: 19 %
Lymphs Abs: 1.6 10*3/uL (ref 0.7–4.0)
MCH: 30.7 pg (ref 26.0–34.0)
MCHC: 35 g/dL (ref 30.0–36.0)
MCV: 87.7 fL (ref 80.0–100.0)
Monocytes Absolute: 0.6 10*3/uL (ref 0.1–1.0)
Monocytes Relative: 8 %
Neutro Abs: 5.8 10*3/uL (ref 1.7–7.7)
Neutrophils Relative %: 72 %
Platelets: 237 10*3/uL (ref 150–400)
RBC: 4.86 MIL/uL (ref 4.22–5.81)
RDW: 12.2 % (ref 11.5–15.5)
WBC: 8.1 10*3/uL (ref 4.0–10.5)
nRBC: 0 % (ref 0.0–0.2)

## 2019-06-04 LAB — RENAL FUNCTION PANEL
Albumin: 3.5 g/dL (ref 3.5–5.0)
Anion gap: 13 (ref 5–15)
BUN: 13 mg/dL (ref 6–20)
CO2: 19 mmol/L — ABNORMAL LOW (ref 22–32)
Calcium: 8.5 mg/dL — ABNORMAL LOW (ref 8.9–10.3)
Chloride: 93 mmol/L — ABNORMAL LOW (ref 98–111)
Creatinine, Ser: 0.82 mg/dL (ref 0.61–1.24)
GFR calc Af Amer: >60 mL/min (ref >60)
GFR calc non Af Amer: >60 mL/min (ref >60)
Glucose, Bld: 220 mg/dL — ABNORMAL HIGH (ref 70–99)
Phosphorus: 2 mg/dL — ABNORMAL LOW (ref 2.5–4.6)
Potassium: 3.5 mmol/L (ref 3.5–5.1)
Sodium: 125 mmol/L — ABNORMAL LOW (ref 135–145)

## 2019-06-04 NOTE — Progress Notes (Signed)
Date:  06/04/2019   Name:  Marcus Baldwin [facility]   DOB:  1971-02-21   MRN:  [id number]   Chief Complaint: Hypertension  Hypertension This is a chronic problem. The current episode started more than 1 year ago. The problem has been waxing and waning since onset. The problem is controlled. Associated symptoms include headaches, neck pain, palpitations and shortness of breath. Pertinent negatives include no anxiety, blurred vision, chest pain, malaise/fatigue, orthopnea, peripheral edema, PND or sweats. Risk factors for coronary artery disease include dyslipidemia. Past treatments include angiotensin blockers. The current treatment provides moderate improvement. There are no compliance problems.  There is no history of angina, kidney disease, CAD/MI, CVA, heart failure, left ventricular hypertrophy, PVD or retinopathy. There is no history of chronic renal disease, a hypertension causing med or renovascular disease.  Headache  This is a new problem. The current episode started in the past 7 days. The pain is located in the bilateral and frontal region. The pain radiates to the face (right facial). The quality of the pain is described as aching. The pain is moderate. Associated symptoms include ear pain, insomnia, nausea and neck pain. Pertinent negatives include no abdominal pain, abnormal behavior, anorexia, back pain, blurred vision, coughing, dizziness, drainage, eye pain, fever, loss of balance, muscle aches, numbness, scalp tenderness, sinus pressure, sore throat, swollen glands, tingling, tinnitus, visual change or vomiting. Associated symptoms comments: Mild/nausea. His past medical history is significant for hypertension.    Lab Results  Component Value Date   CREATININE 0.92 01/01/2019   BUN 18 01/01/2019   NA 132 (L) 01/01/2019   K 3.7 01/01/2019   CL 96 (L) 01/01/2019   CO2 25 01/01/2019   Lab Results  Component Value Date   CHOL 202 (H) 02/06/2016   HDL 33 (L) 02/06/2016   LDLCALC 144 (H) 02/06/2016   TRIG 125 02/06/2016   CHOLHDL 6.1 (H) 02/06/2016   Lab Results  Component Value Date   TSH 2.699 01/02/2019   Lab Results  Component Value Date   HGBA1C 7.8 (H) 01/02/2019     Review of Systems  Constitutional: Negative for chills, fever and malaise/fatigue.  HENT: Positive for ear pain. Negative for drooling, ear discharge, sinus pressure, sore throat and tinnitus.   Eyes: Negative for blurred vision and pain.  Respiratory: Positive for shortness of breath. Negative for cough and wheezing.   Cardiovascular: Positive for palpitations. Negative for chest pain, orthopnea, leg swelling and PND.  Gastrointestinal: Positive for nausea. Negative for abdominal pain, anorexia, blood in stool, constipation, diarrhea and vomiting.  Endocrine: Negative for polydipsia.  Genitourinary: Negative for dysuria, frequency, hematuria and urgency.  Musculoskeletal: Positive for neck pain. Negative for back pain and myalgias.  Skin: Negative for rash.  Allergic/Immunologic: Negative for environmental allergies.  Neurological: Positive for headaches. Negative for dizziness, tingling, numbness and loss of balance.  Hematological: Does not bruise/bleed easily.  Psychiatric/Behavioral: Negative for suicidal ideas. The patient has insomnia. The patient is not nervous/anxious.     Patient Active Problem List   Diagnosis Date Noted  . Seizure ([place]) 01/02/2019  . DDD (degenerative disc disease), cervical 08/08/2017  . Type 2 diabetes mellitus without complication, without long-term current use of insulin ([place]) 07/27/2017  . Obesity (BMI 30.0-34.9) 03/11/2017  . Hyperlipidemia due to type 2 diabetes mellitus ([place]) 01/28/2017    No Known Allergies  Past Surgical History:  Procedure Laterality Date  . BREAST SURGERY Left    lumpectomy  . CHOLECYSTECTOMY    .  KNEE ARTHROSCOPY Left     Social History   Tobacco Use  . Smoking status: Never Smoker  . Smokeless tobacco:  Never Used  Substance Use Topics  . Alcohol use: Yes  . Drug use: No     Medication list has been reviewed and updated.  Current Meds  Medication Sig  . Dulaglutide (TRULICITY) 1.5 0000000 SOPN Inject 1.5 mg into the skin every Sunday.   . levETIRAcetam (KEPPRA) 500 MG tablet Take 1 tablet (500 mg total) by mouth 2 (two) times daily.  . metFORMIN (GLUCOPHAGE) 1000 MG tablet Take 1,000 mg by mouth 2 (two) times daily with a meal.   . olmesartan (BENICAR) 20 MG tablet Take 20 mg by mouth at bedtime.   [name] ULTRA test strip   . rosuvastatin (CRESTOR) 5 MG tablet Take 5 mg by mouth daily.    PHQ 2/9 Scores 12/07/2018 03/03/2017 03/03/2017  PHQ - 2 Score 0 0 0  PHQ- 9 Score 0 0 -    BP Readings from Last 3 Encounters:  06/04/19 126/80  01/03/19 (!) 151/98  12/26/18 (!) 147/87    Physical Exam Vitals and nursing note reviewed.  HENT:     Head: Normocephalic.     Right Ear: External ear normal. There is impacted cerumen.     Left Ear: Tympanic membrane, ear canal and external ear normal.     Nose: Nose normal.  Eyes:     General: No scleral icterus.       Right eye: No discharge.        Left eye: No discharge.     Conjunctiva/sclera: Conjunctivae normal.     Pupils: Pupils are equal, round, and reactive to light.  Neck:     Thyroid: No thyromegaly.     Vascular: No JVD.     Trachea: Trachea normal. No tracheal deviation.     Comments: Submandibular swelling Cardiovascular:     Rate and Rhythm: Normal rate and regular rhythm.     Pulses: Normal pulses.     Heart sounds: Normal heart sounds, S1 normal and S2 normal. No murmur. No systolic murmur. No diastolic murmur. No friction rub. No gallop. No S3 or S4 sounds.   Pulmonary:     Effort: No respiratory distress.     Breath sounds: Normal breath sounds. No decreased breath sounds, wheezing, rhonchi or rales.  Abdominal:     General: Bowel sounds are normal. There is no distension or abdominal bruit. There are no  signs of injury.     Palpations: Abdomen is soft. There is no hepatomegaly, splenomegaly or mass.     Tenderness: There is no abdominal tenderness. There is no guarding or rebound.  Musculoskeletal:        General: No tenderness. Normal range of motion.     Cervical back: Normal range of motion and neck supple. Edema present. No erythema or rigidity.     Right lower leg: No edema.     Left lower leg: No edema.  Lymphadenopathy:     Cervical: No cervical adenopathy.  Skin:    General: Skin is warm.     Findings: No rash.  Neurological:     Mental Status: He is alert and oriented to person, place, and time.     Cranial Nerves: No cranial nerve deficit.     Deep Tendon Reflexes: Reflexes are normal and symmetric.     Wt Readings from Last 3 Encounters:  06/04/19 232 lb (105.2 kg)  01/03/19  228 lb 6.3 oz (103.6 kg)  12/26/18 233 lb (105.7 kg)    BP 126/80   Pulse 60   Ht 5\' 10"  (1.778 m)   Wt 232 lb (105.2 kg)   BMI 33.29 kg/m   Assessment and Plan: 1. Elevated blood pressure reading Patient has some elevation of his blood pressure but is gradually coming down from earlier this morning.  Presently he is doing well but patient has had a headache probably secondary from swelling see below  2. Facial hematoma, subsequent encounter Patient has swelling and hematoma that has accumulated submandibular primarily on the right side which is secondary likely to a hematoma with bleeding that has followed along the axial planes and has accumulated in the submandibular area.  Patient's been instructed to get into a recliner and be in an upright position rather than lying down Supine. 3. Atelectasis Patient has a low-grade fever 99. 5-100.5 but there is no nuchal rigidity.  Breath sounds are noted throughout but are somewhat decreased.  This could be consistent with some atelectasis postop and have encouraged patient to do his pulmonary eating exercises as well as sitting supine and stretching  the lungs vertically.  4. Frontal headache Patient is having frontal headache which I suspect is due to to some edema.  Neurosurgery is going to call in a steroid I suspect to decrease the swelling in the meantime we will get a baseline CBC and renal panel to assess GFR prior to administration of steroid.

## 2019-06-12 DIAGNOSIS — D1800 Hemangioma unspecified site: Secondary | ICD-10-CM | POA: Diagnosis not present

## 2019-06-12 DIAGNOSIS — R569 Unspecified convulsions: Secondary | ICD-10-CM | POA: Diagnosis not present

## 2019-06-12 DIAGNOSIS — R9089 Other abnormal findings on diagnostic imaging of central nervous system: Secondary | ICD-10-CM | POA: Diagnosis not present

## 2019-06-21 ENCOUNTER — Encounter: Payer: Self-pay | Admitting: Family Medicine

## 2019-06-21 ENCOUNTER — Other Ambulatory Visit: Payer: Self-pay

## 2019-06-21 ENCOUNTER — Ambulatory Visit: Payer: BC Managed Care – PPO | Admitting: Family Medicine

## 2019-06-21 VITALS — BP 130/100 | HR 104 | Temp 98.6°F | Ht 70.0 in | Wt 222.0 lb

## 2019-06-21 DIAGNOSIS — J029 Acute pharyngitis, unspecified: Secondary | ICD-10-CM

## 2019-06-21 DIAGNOSIS — R030 Elevated blood-pressure reading, without diagnosis of hypertension: Secondary | ICD-10-CM | POA: Diagnosis not present

## 2019-06-21 DIAGNOSIS — E7801 Familial hypercholesterolemia: Secondary | ICD-10-CM | POA: Diagnosis not present

## 2019-06-21 DIAGNOSIS — Z23 Encounter for immunization: Secondary | ICD-10-CM | POA: Diagnosis not present

## 2019-06-21 MED ORDER — OLMESARTAN MEDOXOMIL 40 MG PO TABS: 40.0000 mg | ORAL_TABLET | Freq: Every day | ORAL | 1 refills | Status: DC

## 2019-06-21 MED ORDER — AZITHROMYCIN 250 MG PO TABS: ORAL_TABLET | ORAL | 0 refills | Status: DC

## 2019-06-21 MED ORDER — ROSUVASTATIN CALCIUM 5 MG PO TABS: 5.0000 mg | ORAL_TABLET | Freq: Every day | ORAL | 1 refills | Status: AC

## 2019-06-21 NOTE — Progress Notes (Signed)
Date:  06/21/2019   Name:  Marcus Baldwin [facility]   DOB:  March 13, 1971   MRN:  [id number]   Chief Complaint: Adenopathy (below right ear swollen/ slight sore throat ), Diabetes Mellitus, and Hyperlipidemia  Hyperlipidemia This is a chronic problem. The current episode started more than 1 year ago. The problem is controlled. Recent lipid tests were reviewed and are normal. Exacerbating diseases include obesity. He has no history of chronic renal disease, diabetes, hypothyroidism, liver disease or nephrotic syndrome. Pertinent negatives include no chest pain, focal sensory loss, focal weakness, leg pain, myalgias or shortness of breath. Current antihyperlipidemic treatment includes statins. The current treatment provides moderate improvement of lipids. There are no compliance problems.  Risk factors for coronary artery disease include dyslipidemia, diabetes mellitus, hypertension and obesity.  Hypertension This is a chronic problem. The current episode started 1 to 4 weeks ago. The problem has been waxing and waning since onset. The problem is uncontrolled. Pertinent negatives include no blurred vision, chest pain, headaches, neck pain, orthopnea, palpitations, PND or shortness of breath. Past treatments include ACE inhibitors. The current treatment provides moderate improvement. There are no compliance problems.  There is no history of chronic renal disease.  Sore Throat  This is a new problem. The current episode started in the past 7 days. The problem has been gradually worsening. The pain is worse on the right side. There has been no fever. The pain is mild. Pertinent negatives include no abdominal pain, coughing, diarrhea, drooling, ear discharge, ear pain, headaches, neck pain or shortness of breath. He has had no exposure to strep. The treatment provided mild relief.    Lab Results  Component Value Date   CREATININE 0.82 06/04/2019   BUN 13 06/04/2019   NA 125 (L) 06/04/2019   K 3.5  06/04/2019   CL 93 (L) 06/04/2019   CO2 19 (L) 06/04/2019   Lab Results  Component Value Date   CHOL 202 (H) 02/06/2016   HDL 33 (L) 02/06/2016   LDLCALC 144 (H) 02/06/2016   TRIG 125 02/06/2016   CHOLHDL 6.1 (H) 02/06/2016   Lab Results  Component Value Date   TSH 2.699 01/02/2019   Lab Results  Component Value Date   HGBA1C 7.8 (H) 01/02/2019   Lab Results  Component Value Date   WBC 8.1 06/04/2019   HGB 14.9 06/04/2019   HCT 42.6 06/04/2019   MCV 87.7 06/04/2019   PLT 237 06/04/2019   Lab Results  Component Value Date   ALT 53 (H) 01/01/2019   AST 29 01/01/2019   ALKPHOS 41 01/01/2019   BILITOT 0.8 01/01/2019     Review of Systems  Constitutional: Negative for chills and fever.  HENT: Negative for drooling, ear discharge, ear pain and sore throat.   Eyes: Negative for blurred vision.  Respiratory: Negative for cough, shortness of breath and wheezing.   Cardiovascular: Negative for chest pain, palpitations, orthopnea, leg swelling and PND.  Gastrointestinal: Negative for abdominal pain, blood in stool, constipation, diarrhea and nausea.  Endocrine: Negative for polydipsia.  Genitourinary: Negative for dysuria, frequency, hematuria and urgency.  Musculoskeletal: Negative for back pain, myalgias and neck pain.  Skin: Negative for rash.  Allergic/Immunologic: Negative for environmental allergies.  Neurological: Negative for dizziness, focal weakness and headaches.  Hematological: Does not bruise/bleed easily.  Psychiatric/Behavioral: Negative for suicidal ideas. The patient is not nervous/anxious.     Patient Active Problem List   Diagnosis Date Noted  . Seizure ([place]) 01/02/2019  .  DDD (degenerative disc disease), cervical 08/08/2017  . Type 2 diabetes mellitus without complication, without long-term current use of insulin ([place]) 07/27/2017  . Obesity (BMI 30.0-34.9) 03/11/2017  . Hyperlipidemia due to type 2 diabetes mellitus ([place]) 01/28/2017    No Known  Allergies  Past Surgical History:  Procedure Laterality Date  . BREAST SURGERY Left    lumpectomy  . CHOLECYSTECTOMY    . KNEE ARTHROSCOPY Left     Social History   Tobacco Use  . Smoking status: Never Smoker  . Smokeless tobacco: Never Used  Substance Use Topics  . Alcohol use: Yes  . Drug use: No     Medication list has been reviewed and updated.  Current Meds  Medication Sig  . Dulaglutide (TRULICITY) 1.5 0000000 SOPN Inject 1.5 mg into the skin every Sunday.   . levETIRAcetam (KEPPRA) 500 MG tablet Take 1 tablet (500 mg total) by mouth 2 (two) times daily.  . metFORMIN (GLUCOPHAGE) 1000 MG tablet Take 1,000 mg by mouth 2 (two) times daily with a meal.   . olmesartan (BENICAR) 20 MG tablet Take 20 mg by mouth at bedtime.   [name] ULTRA test strip   . rosuvastatin (CRESTOR) 5 MG tablet Take 5 mg by mouth daily.    PHQ 2/9 Scores 06/21/2019 12/07/2018 03/03/2017 03/03/2017  PHQ - 2 Score 0 0 0 0  PHQ- 9 Score 9 0 0 -    BP Readings from Last 3 Encounters:  06/21/19 (!) 130/100  06/04/19 126/80  01/03/19 (!) 151/98    Physical Exam Vitals and nursing note reviewed.  Constitutional:      Appearance: He is obese.  HENT:     Head: Normocephalic.     Right Ear: Tympanic membrane, ear canal and external ear normal.     Left Ear: Tympanic membrane, ear canal and external ear normal.     Nose: Nose normal.     Mouth/Throat:     Pharynx: Uvula midline. Posterior oropharyngeal erythema present. No oropharyngeal exudate.     Tonsils: No tonsillar exudate.  Eyes:     General: No scleral icterus.       Right eye: No discharge.        Left eye: No discharge.     Conjunctiva/sclera: Conjunctivae normal.     Pupils: Pupils are equal, round, and reactive to light.  Neck:     Thyroid: No thyromegaly.     Vascular: No JVD.     Trachea: No tracheal deviation.  Cardiovascular:     Rate and Rhythm: Normal rate and regular rhythm.     Heart sounds: Normal heart sounds.  No murmur. No friction rub. No gallop.   Pulmonary:     Effort: No respiratory distress.     Breath sounds: Normal breath sounds. No wheezing, rhonchi or rales.  Abdominal:     General: Bowel sounds are normal.     Palpations: Abdomen is soft. There is no mass.     Tenderness: There is no abdominal tenderness. There is no guarding or rebound.  Musculoskeletal:        General: No tenderness. Normal range of motion.     Cervical back: Normal range of motion and neck supple.  Lymphadenopathy:     Head:     Right side of head: Submandibular and tonsillar adenopathy present.     Left side of head: Submandibular and tonsillar adenopathy present.     Cervical: No cervical adenopathy.     Comments: tender  Skin:    General: Skin is warm.     Findings: No rash.  Neurological:     Mental Status: He is alert and oriented to person, place, and time.     Cranial Nerves: No cranial nerve deficit.     Deep Tendon Reflexes: Reflexes are normal and symmetric.     Wt Readings from Last 3 Encounters:  06/21/19 222 lb (100.7 kg)  06/04/19 232 lb (105.2 kg)  01/03/19 228 lb 6.3 oz (103.6 kg)    BP (!) 130/100   Pulse (!) 104   Temp 98.6 F (37 C) (Oral)   Ht 5\' 10"  (1.778 m)   Wt 222 lb (100.7 kg)   BMI 31.85 kg/m   Assessment and Plan: 1. Elevated blood pressure reading Chronic.  Uncontrolled.  Stable.  Patient has elevated readings today of 130/100.  We will increase olmesartan to 40 mg.  Patient will take 2 of his 20 mg Benicar and if tolerated will progress to 40 mg on a daily basis.  We will recheck patient in 6 to 8 weeks. - olmesartan (BENICAR) 40 MG tablet; Take 1 tablet (40 mg total) by mouth daily.  Dispense: 90 tablet; Refill: 1  2. Pharyngitis, unspecified etiology Acute.  Persistent.  Exam notes erythema of the posterior pharynx with tender adenopathy submandibular and tonsillar.  This is consistent with a pharyngitis likely secondary to strep and we will initiate azithromycin  to 50 mg 2 today followed by 1 a day for 4 days. - azithromycin (ZITHROMAX) 250 MG tablet; 2 today then 1 a day for 4 days  Dispense: 6 tablet; Refill: 0  3. Familial hypercholesterolemia Chronic.  Controlled.  Stable.  Continue Crestor 5 mg once a day. - rosuvastatin (CRESTOR) 5 MG tablet; Take 1 tablet (5 mg total) by mouth daily.  Dispense: 90 tablet; Refill: 1

## 2019-07-04 ENCOUNTER — Telehealth: Payer: Self-pay

## 2019-07-04 NOTE — Telephone Encounter (Signed)
Called and left message for pt to decrease to olmesartan 20mg - due to c/o diarrhea and night sweats since the increase to 40mg . He has been asked to call and sched appt for b/p recheck for next week

## 2019-07-12 ENCOUNTER — Other Ambulatory Visit: Payer: Self-pay

## 2019-07-12 ENCOUNTER — Ambulatory Visit (INDEPENDENT_AMBULATORY_CARE_PROVIDER_SITE_OTHER): Payer: BC Managed Care – PPO | Admitting: Family Medicine

## 2019-07-12 ENCOUNTER — Encounter: Payer: Self-pay | Admitting: Family Medicine

## 2019-07-12 VITALS — BP 126/88 | HR 100 | Ht 70.0 in | Wt 223.0 lb

## 2019-07-12 DIAGNOSIS — Z23 Encounter for immunization: Secondary | ICD-10-CM | POA: Diagnosis not present

## 2019-07-12 DIAGNOSIS — I10 Essential (primary) hypertension: Secondary | ICD-10-CM | POA: Diagnosis not present

## 2019-07-12 MED ORDER — OLMESARTAN MEDOXOMIL 20 MG PO TABS: 20.0000 mg | ORAL_TABLET | Freq: Every day | ORAL | 1 refills | Status: DC

## 2019-07-12 NOTE — Patient Instructions (Signed)

## 2019-07-12 NOTE — Progress Notes (Signed)
Date:  07/12/2019   Name:  Marcus Baldwin [facility]   DOB:  01-10-1971   MRN:  [id number]   Chief Complaint: Hypertension (b/p recheck )  Hypertension This is a chronic problem. The current episode started more than 1 year ago. The problem has been gradually improving since onset. The problem is controlled. Associated symptoms include sweats. Pertinent negatives include no anxiety, blurred vision, chest pain, headaches, malaise/fatigue, neck pain, orthopnea, palpitations, peripheral edema, PND or shortness of breath. There are no associated agents to hypertension. There are no known risk factors for coronary artery disease. Past treatments include angiotensin blockers. The current treatment provides moderate improvement. There are no compliance problems.  There is no history of angina, kidney disease, CAD/MI, CVA, heart failure, left ventricular hypertrophy, PVD or retinopathy. There is no history of chronic renal disease, a hypertension causing med or renovascular disease.    Lab Results  Component Value Date   CREATININE 0.82 06/04/2019   BUN 13 06/04/2019   NA 125 (L) 06/04/2019   K 3.5 06/04/2019   CL 93 (L) 06/04/2019   CO2 19 (L) 06/04/2019   Lab Results  Component Value Date   CHOL 202 (H) 02/06/2016   HDL 33 (L) 02/06/2016   LDLCALC 144 (H) 02/06/2016   TRIG 125 02/06/2016   CHOLHDL 6.1 (H) 02/06/2016   Lab Results  Component Value Date   TSH 2.699 01/02/2019   Lab Results  Component Value Date   HGBA1C 7.8 (H) 01/02/2019   Lab Results  Component Value Date   WBC 8.1 06/04/2019   HGB 14.9 06/04/2019   HCT 42.6 06/04/2019   MCV 87.7 06/04/2019   PLT 237 06/04/2019   Lab Results  Component Value Date   ALT 53 (H) 01/01/2019   AST 29 01/01/2019   ALKPHOS 41 01/01/2019   BILITOT 0.8 01/01/2019     Review of Systems  Constitutional: Negative for chills, fever and malaise/fatigue.  HENT: Negative for drooling, ear discharge, ear pain and sore throat.     Eyes: Negative for blurred vision.  Respiratory: Negative for cough, shortness of breath and wheezing.   Cardiovascular: Negative for chest pain, palpitations, orthopnea, leg swelling and PND.  Gastrointestinal: Negative for abdominal pain, blood in stool, constipation, diarrhea and nausea.  Endocrine: Negative for polydipsia.  Genitourinary: Negative for dysuria, frequency, hematuria and urgency.  Musculoskeletal: Negative for back pain, myalgias and neck pain.  Skin: Negative for rash.  Allergic/Immunologic: Negative for environmental allergies.  Neurological: Negative for dizziness and headaches.  Hematological: Does not bruise/bleed easily.  Psychiatric/Behavioral: Negative for suicidal ideas. The patient is not nervous/anxious.     Patient Active Problem List   Diagnosis Date Noted  . Seizure ([place]) 01/02/2019  . DDD (degenerative disc disease), cervical 08/08/2017  . Type 2 diabetes mellitus without complication, without long-term current use of insulin ([place]) 07/27/2017  . Obesity (BMI 30.0-34.9) 03/11/2017  . Hyperlipidemia due to type 2 diabetes mellitus ([place]) 01/28/2017    No Known Allergies  Past Surgical History:  Procedure Laterality Date  . BREAST SURGERY Left    lumpectomy  . CHOLECYSTECTOMY    . KNEE ARTHROSCOPY Left     Social History   Tobacco Use  . Smoking status: Never Smoker  . Smokeless tobacco: Never Used  Substance Use Topics  . Alcohol use: Yes  . Drug use: No     Medication list has been reviewed and updated.  Current Meds  Medication Sig  . azithromycin (ZITHROMAX) 250  MG tablet 2 today then 1 a day for 4 days  . Dulaglutide (TRULICITY) 1.5 0000000 SOPN Inject 1.5 mg into the skin every Sunday.   . levETIRAcetam (KEPPRA) 500 MG tablet Take 1 tablet (500 mg total) by mouth 2 (two) times daily.  . metFORMIN (GLUCOPHAGE) 1000 MG tablet Take 1,000 mg by mouth 2 (two) times daily with a meal.   . olmesartan (BENICAR) 40 MG tablet Take 1  tablet (40 mg total) by mouth daily. (Patient taking differently: Take 20 mg by mouth daily. )  . ONETOUCH ULTRA test strip   . rosuvastatin (CRESTOR) 5 MG tablet Take 1 tablet (5 mg total) by mouth daily.    PHQ 2/9 Scores 07/12/2019 06/21/2019 12/07/2018 03/03/2017  PHQ - 2 Score 0 0 0 0  PHQ- 9 Score 4 9 0 0    BP Readings from Last 3 Encounters:  07/12/19 126/88  06/21/19 (!) 130/100  06/04/19 126/80    Physical Exam Vitals and nursing note reviewed.  HENT:     Head: Normocephalic.     Right Ear: Tympanic membrane, ear canal and external ear normal.     Left Ear: Tympanic membrane, ear canal and external ear normal.     Nose: Nose normal. No congestion or rhinorrhea.     Mouth/Throat:     Mouth: Mucous membranes are moist.  Eyes:     General: No scleral icterus.       Right eye: No discharge.        Left eye: No discharge.     Conjunctiva/sclera: Conjunctivae normal.     Pupils: Pupils are equal, round, and reactive to light.  Neck:     Thyroid: No thyromegaly.     Vascular: No JVD.     Trachea: No tracheal deviation.  Cardiovascular:     Rate and Rhythm: Normal rate and regular rhythm.     Heart sounds: Normal heart sounds. No murmur. No friction rub. No gallop.   Pulmonary:     Effort: No respiratory distress.     Breath sounds: Normal breath sounds. No stridor. No wheezing, rhonchi or rales.  Chest:     Chest wall: No tenderness.  Abdominal:     General: Bowel sounds are normal.     Palpations: Abdomen is soft. There is no mass.     Tenderness: There is no abdominal tenderness. There is no guarding or rebound.  Musculoskeletal:        General: No tenderness. Normal range of motion.     Cervical back: Normal range of motion and neck supple.  Lymphadenopathy:     Cervical: No cervical adenopathy.  Skin:    General: Skin is warm.     Capillary Refill: Capillary refill takes less than 2 seconds.     Findings: No rash.  Neurological:     Mental Status: He is  alert and oriented to person, place, and time.     Cranial Nerves: No cranial nerve deficit.     Deep Tendon Reflexes: Reflexes are normal and symmetric.     Wt Readings from Last 3 Encounters:  07/12/19 223 lb (101.2 kg)  06/21/19 222 lb (100.7 kg)  06/04/19 232 lb (105.2 kg)    BP 126/88   Pulse 100   Ht 5\' 10"  (1.778 m)   Wt 223 lb (101.2 kg)   BMI 32.00 kg/m   Assessment and Plan:  1. Essential hypertension Chronic. Con Trolled.  Stable.  Uncomplicated.  Since decreasing olmesartan to  20 mg patient has no longer has nocturnal diaphoresis and is tolerating medication well.  We will continue at olmesartan 20 mg once a day.  Patient will continue 40 mg one half a tablet until current dosing is complete.  Patient will return in 6 months.   - olmesartan (BENICAR) 20 MG tablet; Take 1 tablet (20 mg total) by mouth daily.  Dispense: 90 tablet; Refill: 1

## 2019-07-12 NOTE — Addendum Note (Signed)
Addended by: Fredderick Severance on: 07/12/2019 12:56 PM   Modules accepted: Orders

## 2019-08-02 ENCOUNTER — Ambulatory Visit: Payer: BC Managed Care – PPO | Admitting: Family Medicine

## 2019-08-09 ENCOUNTER — Other Ambulatory Visit: Payer: Self-pay

## 2019-08-09 DIAGNOSIS — B029 Zoster without complications: Secondary | ICD-10-CM

## 2019-08-09 MED ORDER — VALACYCLOVIR HCL 1 G PO TABS: 1000.0000 mg | ORAL_TABLET | Freq: Three times a day (TID) | ORAL | 0 refills | Status: DC

## 2019-08-09 NOTE — Progress Notes (Signed)
Sent in Valtrex to [facility] for shingles

## 2019-08-10 ENCOUNTER — Ambulatory Visit (INDEPENDENT_AMBULATORY_CARE_PROVIDER_SITE_OTHER): Payer: BC Managed Care – PPO | Admitting: Family Medicine

## 2019-08-10 ENCOUNTER — Other Ambulatory Visit: Payer: Self-pay

## 2019-08-10 ENCOUNTER — Encounter: Payer: Self-pay | Admitting: Family Medicine

## 2019-08-10 VITALS — BP 132/78 | HR 92 | Temp 96.9°F | Ht 70.0 in | Wt 221.0 lb

## 2019-08-10 DIAGNOSIS — B029 Zoster without complications: Secondary | ICD-10-CM

## 2019-08-10 NOTE — Progress Notes (Signed)
Date:  08/10/2019   Name:  Marcus Baldwin [facility]   DOB:  [birth year]   MRN:  [id number]   Chief Complaint: Rash (Under right arm. Painful on and off. Had shingles twice before. Feels like shingles to pt again. )  Rash This is a recurrent problem. The current episode started yesterday. The problem has been gradually worsening since onset. The affected locations include the chest (axillary). The rash is characterized by burning. Pertinent negatives include no anorexia, congestion, cough, diarrhea, eye pain, facial edema, fatigue, fever, joint pain, nail changes, rhinorrhea, shortness of breath, sore throat or vomiting. 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    Lab Results  Component Value Date   CREATININE 0.82 06/04/2019   BUN 13 06/04/2019   NA 125 (L) 06/04/2019   K 3.5 06/04/2019   CL 93 (L) 06/04/2019   CO2 19 (L) 06/04/2019   Lab Results  Component Value Date   CHOL 202 (H) 02/06/2016   HDL 33 (L) 02/06/2016   LDLCALC 144 (H) 02/06/2016   TRIG 125 02/06/2016   CHOLHDL 6.1 (H) 02/06/2016   Lab Results  Component Value Date   TSH 2.699 01/02/2019   Lab Results  Component Value Date   HGBA1C 7.8 (H) 01/02/2019   Lab Results  Component Value Date   WBC 8.1 06/04/2019   HGB 14.9 06/04/2019   HCT 42.6 06/04/2019   MCV 87.7 06/04/2019   PLT 237 06/04/2019   Lab Results  Component Value Date   ALT 53 (H) 01/01/2019   AST 29 01/01/2019   ALKPHOS 41 01/01/2019   BILITOT 0.8 01/01/2019     Review of Systems  Constitutional: Negative for chills, fatigue and fever.  HENT: Negative for congestion, drooling, ear discharge, ear pain, rhinorrhea and sore throat.   Eyes: Negative for pain.  Respiratory: Negative for cough, shortness of breath and wheezing.   Cardiovascular: Negative for chest pain, palpitations and leg swelling.  Gastrointestinal: Negative for abdominal pain, anorexia, blood in stool, constipation, diarrhea, nausea and vomiting.  Endocrine: Negative for  polydipsia.  Genitourinary: Negative for dysuria, frequency, hematuria and urgency.  Musculoskeletal: Negative for back pain, joint pain, myalgias and neck pain.  Skin: Positive for rash. Negative for nail changes.  Allergic/Immunologic: Negative for environmental allergies.  Neurological: Negative for dizziness and headaches.  Hematological: Does not bruise/bleed easily.  Psychiatric/Behavioral: Negative for suicidal ideas. The patient is not nervous/anxious.     Patient Active Problem List   Diagnosis Date Noted  . Seizure ([place]) 01/02/2019  . DDD (degenerative disc disease), cervical 08/08/2017  . Type 2 diabetes mellitus without complication, without long-term current use of insulin ([place]) 07/27/2017  . Obesity (BMI 30.0-34.9) 03/11/2017  . Hyperlipidemia due to type 2 diabetes mellitus ([place]) 01/28/2017    No Known Allergies  Past Surgical History:  Procedure Laterality Date  . BREAST SURGERY Left    lumpectomy  . CHOLECYSTECTOMY    . KNEE ARTHROSCOPY Left     Social History   Tobacco Use  . Smoking status: Never Smoker  . Smokeless tobacco: Never Used  Substance Use Topics  . Alcohol use: Yes  . Drug use: No     Medication list has been reviewed and updated.  Current Meds  Medication Sig  . Dulaglutide (TRULICITY) 1.5 0000000 SOPN Inject 1.5 mg into the skin every Sunday.   . levETIRAcetam (KEPPRA) 500 MG tablet Take 1 tablet (500 mg total) by mouth 2 (two) times daily.  . metFORMIN (GLUCOPHAGE) 1000 MG tablet Take  1,000 mg by mouth 2 (two) times daily with a meal.   . olmesartan (BENICAR) 20 MG tablet Take 1 tablet (20 mg total) by mouth daily.  [name] ULTRA test strip   . rosuvastatin (CRESTOR) 5 MG tablet Take 1 tablet (5 mg total) by mouth daily.  . valACYclovir (VALTREX) 1000 MG tablet Take 1 tablet (1,000 mg total) by mouth 3 (three) times daily.    PHQ 2/9 Scores 08/10/2019 07/12/2019 06/21/2019 12/07/2018  PHQ - 2 Score 0 0 0 0  PHQ- 9 Score 0 4 9 0      BP Readings from Last 3 Encounters:  08/10/19 132/78  07/12/19 126/88  06/21/19 (!) 130/100    Physical Exam Vitals and nursing note reviewed.  HENT:     Head: Normocephalic.     Right Ear: Tympanic membrane and external ear normal.     Left Ear: Tympanic membrane and external ear normal.     Nose: Nose normal.  Eyes:     General: No scleral icterus.       Right eye: No discharge.        Left eye: No discharge.     Conjunctiva/sclera: Conjunctivae normal.     Pupils: Pupils are equal, round, and reactive to light.  Neck:     Thyroid: No thyromegaly.     Vascular: No JVD.     Trachea: No tracheal deviation.  Cardiovascular:     Rate and Rhythm: Normal rate and regular rhythm.     Heart sounds: Normal heart sounds. No murmur. No friction rub. No gallop.   Pulmonary:     Effort: No respiratory distress.     Breath sounds: Normal breath sounds. No wheezing, rhonchi or rales.  Abdominal:     General: Bowel sounds are normal.     Palpations: Abdomen is soft. There is no mass.     Tenderness: There is no abdominal tenderness. There is no guarding or rebound.  Musculoskeletal:        General: No tenderness. Normal range of motion.     Cervical back: Normal range of motion and neck supple.  Lymphadenopathy:     Cervical: No cervical adenopathy.  Skin:    General: Skin is warm.     Findings: Rash present. Rash is macular.  Neurological:     Mental Status: He is alert and oriented to person, place, and time.     Cranial Nerves: No cranial nerve deficit.     Deep Tendon Reflexes: Reflexes are normal and symmetric.     Wt Readings from Last 3 Encounters:  08/10/19 221 lb (100.2 kg)  07/12/19 223 lb (101.2 kg)  06/21/19 222 lb (100.7 kg)    BP 132/78   Pulse 92   Temp (!) 96.9 F (36.1 C) (Temporal)   Ht 5\' 10"  (1.778 m)   Wt 221 lb (100.2 kg)   SpO2 99%   BMI 31.71 kg/m   Assessment and Plan:  1. 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  Recurrent.   Currently active with subacute presentation.  Patient has more of a burning underneath the axillary area where it normally occurs and this has been the case on previous exacerbations.  We will initiate valacyclovir 1 g 3 times a day for 7 days.

## 2019-09-06 ENCOUNTER — Encounter: Payer: Self-pay | Admitting: Family Medicine

## 2019-09-17 ENCOUNTER — Ambulatory Visit (INDEPENDENT_AMBULATORY_CARE_PROVIDER_SITE_OTHER): Payer: BC Managed Care – PPO | Admitting: Family Medicine

## 2019-09-17 ENCOUNTER — Encounter: Payer: Self-pay | Admitting: Family Medicine

## 2019-09-17 ENCOUNTER — Other Ambulatory Visit: Payer: Self-pay

## 2019-09-17 VITALS — BP 142/98 | HR 92 | Ht 70.0 in | Wt 224.0 lb

## 2019-09-17 DIAGNOSIS — E785 Hyperlipidemia, unspecified: Secondary | ICD-10-CM

## 2019-09-17 DIAGNOSIS — G5791 Unspecified mononeuropathy of right lower limb: Secondary | ICD-10-CM | POA: Diagnosis not present

## 2019-09-17 DIAGNOSIS — M5412 Radiculopathy, cervical region: Secondary | ICD-10-CM | POA: Diagnosis not present

## 2019-09-17 DIAGNOSIS — E1169 Type 2 diabetes mellitus with other specified complication: Secondary | ICD-10-CM

## 2019-09-17 MED ORDER — MELOXICAM 15 MG PO TABS: 15.0000 mg | ORAL_TABLET | Freq: Every day | ORAL | 1 refills | Status: DC

## 2019-09-17 NOTE — Progress Notes (Signed)
Date:  09/17/2019   Name:  [facility]   DOB:  05-Feb-1971   MRN:  [id number]   Chief Complaint: Neck Pain (was Dx with cervical radiculopathy in 2019- the difference now is partially knumb feeling in hand and foot. )  Neck Pain  This is a recurrent problem. The current episode started more than 1 month ago (since february). The problem occurs constantly. The problem has been waxing and waning. Associated with: remote physical therapy for cervical radiculopathy. The pain is present in the right side. The quality of the pain is described as aching. The pain is moderate. The symptoms are aggravated by twisting (to the right). Associated symptoms include numbness and tingling. Pertinent negatives include no chest pain, fever, headaches or weakness.  Foot Injury  There was no injury mechanism. The pain is present in the right foot and right toes (lateral aspect). The pain is moderate. The pain has been fluctuating since onset. Associated symptoms include numbness and tingling. Pertinent negatives include no inability to bear weight, loss of motion, loss of sensation or muscle weakness. Nothing aggravates the symptoms.    Lab Results  Component Value Date   CREATININE 0.82 06/04/2019   BUN 13 06/04/2019   NA 125 (L) 06/04/2019   K 3.5 06/04/2019   CL 93 (L) 06/04/2019   CO2 19 (L) 06/04/2019   Lab Results  Component Value Date   CHOL 202 (H) 02/06/2016   HDL 33 (L) 02/06/2016   LDLCALC 144 (H) 02/06/2016   TRIG 125 02/06/2016   CHOLHDL 6.1 (H) 02/06/2016   Lab Results  Component Value Date   TSH 2.699 01/02/2019   Lab Results  Component Value Date   HGBA1C 7.8 (H) 01/02/2019   Lab Results  Component Value Date   WBC 8.1 06/04/2019   HGB 14.9 06/04/2019   HCT 42.6 06/04/2019   MCV 87.7 06/04/2019   PLT 237 06/04/2019   Lab Results  Component Value Date   ALT 53 (H) 01/01/2019   AST 29 01/01/2019   ALKPHOS 41 01/01/2019   BILITOT 0.8 01/01/2019     Review  of Systems  Constitutional: Negative for chills and fever.  HENT: Negative for drooling, ear discharge, ear pain and sore throat.   Respiratory: Negative for cough, shortness of breath and wheezing.   Cardiovascular: Negative for chest pain, palpitations and leg swelling.  Gastrointestinal: Negative for abdominal pain, blood in stool, constipation, diarrhea and nausea.  Endocrine: Negative for polydipsia.  Genitourinary: Negative for dysuria, frequency, hematuria and urgency.  Musculoskeletal: Positive for neck pain. Negative for back pain and myalgias.  Skin: Negative for rash.  Allergic/Immunologic: Negative for environmental allergies.  Neurological: Positive for tingling and numbness. Negative for dizziness, weakness and headaches.  Hematological: Does not bruise/bleed easily.  Psychiatric/Behavioral: Negative for suicidal ideas. The patient is not nervous/anxious.     Patient Active Problem List   Diagnosis Date Noted  . Seizure ([place]) 01/02/2019  . DDD (degenerative disc disease), cervical 08/08/2017  . Type 2 diabetes mellitus without complication, without long-term current use of insulin ([place]) 07/27/2017  . Obesity (BMI 30.0-34.9) 03/11/2017  . Hyperlipidemia due to type 2 diabetes mellitus ([place]) 01/28/2017    No Known Allergies  Past Surgical History:  Procedure Laterality Date  . BREAST SURGERY Left    lumpectomy  . CHOLECYSTECTOMY    . KNEE ARTHROSCOPY Left     Social History   Tobacco Use  . Smoking status: Never Smoker  . Smokeless  tobacco: Never Used  Substance Use Topics  . Alcohol use: Yes  . Drug use: No     Medication list has been reviewed and updated.  Current Meds  Medication Sig  . Dulaglutide (TRULICITY) 1.5 UQ/3.3HL SOPN Inject 1.5 mg into the skin every Sunday.   . levETIRAcetam (KEPPRA) 500 MG tablet Take 1 tablet (500 mg total) by mouth 2 (two) times daily.  . metFORMIN (GLUCOPHAGE) 1000 MG tablet Take 1,000 mg by mouth 2 (two) times daily  with a meal.   . olmesartan (BENICAR) 20 MG tablet Take 1 tablet (20 mg total) by mouth daily.  [name] ULTRA test strip   . rosuvastatin (CRESTOR) 5 MG tablet Take 1 tablet (5 mg total) by mouth daily.  . [DISCONTINUED] valACYclovir (VALTREX) 1000 MG tablet Take 1 tablet (1,000 mg total) by mouth 3 (three) times daily.    PHQ 2/9 Scores 08/10/2019 07/12/2019 06/21/2019 12/07/2018  PHQ - 2 Score 0 0 0 0  PHQ- 9 Score 0 4 9 0    GAD 7 : Generalized Anxiety Score 08/10/2019 07/12/2019 06/21/2019  Nervous, Anxious, on Edge 0 0 0  Control/stop worrying 0 0 0  Worry too much - different things 0 0 0  Trouble relaxing 0 0 3  Restless 0 0 0  Easily annoyed or irritable 0 0 0  Afraid - awful might happen 0 0 0  Total GAD 7 Score 0 0 3  Anxiety Difficulty Not difficult at all Not difficult at all Not difficult at all    BP Readings from Last 3 Encounters:  09/17/19 (!) 142/98  08/10/19 132/78  07/12/19 126/88    Physical Exam Vitals and nursing note reviewed.  HENT:     Head: Normocephalic.     Right Ear: Tympanic membrane, ear canal and external ear normal. There is no impacted cerumen.     Left Ear: Tympanic membrane, ear canal and external ear normal. There is no impacted cerumen.     Nose: Nose normal. No congestion or rhinorrhea.     Mouth/Throat:     Mouth: Mucous membranes are moist.  Eyes:     General: No scleral icterus.       Right eye: No discharge.        Left eye: No discharge.     Conjunctiva/sclera: Conjunctivae normal.     Pupils: Pupils are equal, round, and reactive to light.  Neck:     Thyroid: No thyromegaly.     Vascular: No JVD.     Trachea: No tracheal deviation.  Cardiovascular:     Rate and Rhythm: Normal rate and regular rhythm.     Heart sounds: Normal heart sounds. No murmur heard.  No friction rub. No gallop.   Pulmonary:     Effort: No respiratory distress.     Breath sounds: Normal breath sounds. No wheezing, rhonchi or rales.  Abdominal:      General: Bowel sounds are normal.     Palpations: Abdomen is soft. There is no mass.     Tenderness: There is no abdominal tenderness. There is no right CVA tenderness, left CVA tenderness, guarding or rebound.  Musculoskeletal:        General: No tenderness. Normal range of motion.     Cervical back: Normal range of motion and neck supple.  Lymphadenopathy:     Cervical: No cervical adenopathy.  Skin:    General: Skin is warm.     Findings: No bruising, erythema or rash.  Neurological:     Mental Status: He is alert and oriented to person, place, and time.     Cranial Nerves: No cranial nerve deficit.     Deep Tendon Reflexes: Reflexes are normal and symmetric.     Wt Readings from Last 3 Encounters:  09/17/19 224 lb (101.6 kg)  08/10/19 221 lb (100.2 kg)  07/12/19 223 lb (101.2 kg)    BP (!) 142/98   Pulse 92   Ht 5\' 10"  (1.778 m)   Wt 224 lb (101.6 kg)   BMI 32.14 kg/m   Assessment and Plan: 1. Cervical radiculopathy Chronic.  Recurrent.  Relatively stable with not so much pain but is having paresthesias/numbness of the ulnar aspect of right arm/hand numbness.  Patient had a previous history for which he underwent physical therapy by a therapist by the name of Ferry.  Patient would like to return for her therapy if this is able and we will put in a physical therapy referral for you went evaluation and treatment thereof.  In the meantime we will initiate meloxicam 15 mg a day. - meloxicam (MOBIC) 15 MG tablet; Take 1 tablet (15 mg total) by mouth daily.  Dispense: 30 tablet; Refill: 1 - Ambulatory referral to Physical Therapy  2. Neuropathy of right foot Patient also has some numbness of the lateral aspect of his right ankle and foot.  This would probably be more likely a localized issue but it may reflect some lumbar radiculopathy seen that he also has a history of degenerative disc disease of the lumbar spine. - meloxicam (MOBIC) 15 MG tablet; Take 1 tablet (15 mg total)  by mouth daily.  Dispense: 30 tablet; Refill: 1 - Ambulatory referral to Physical Therapy  3. Hyperlipidemia due to type 2 diabetes mellitus [facility]) Patient is currently on Crestor for control of cholesterol while he is followed for his diabetes by endocrinology.  It has been discussed with patient that he also needs to find his last diabetic eye exam so we can record date.

## 2019-09-18 ENCOUNTER — Telehealth: Payer: Self-pay | Admitting: Family Medicine

## 2019-09-18 ENCOUNTER — Encounter: Payer: Self-pay | Admitting: Family Medicine

## 2019-09-18 NOTE — Telephone Encounter (Signed)
Copied from [place] [id number]. Topic: Referral - Status >> Sep 18, 2019  1:43 PM Scherrie Gerlach wrote: Reason for CRM: pt states he was to call Baxter Flattery back if he had not heard from PT referral.  Advised pt referral was done yesterday, and he could call [facility] PT to schedule.  [place], pt states he was to call Baxter Flattery if he had not heard anything.

## 2019-10-02 DIAGNOSIS — M6281 Muscle weakness (generalized): Secondary | ICD-10-CM | POA: Diagnosis not present

## 2019-10-02 DIAGNOSIS — M542 Cervicalgia: Secondary | ICD-10-CM | POA: Diagnosis not present

## 2019-10-04 ENCOUNTER — Ambulatory Visit (INDEPENDENT_AMBULATORY_CARE_PROVIDER_SITE_OTHER): Payer: BC Managed Care – PPO | Admitting: Family Medicine

## 2019-10-04 ENCOUNTER — Other Ambulatory Visit: Payer: Self-pay

## 2019-10-04 ENCOUNTER — Encounter: Payer: Self-pay | Admitting: Family Medicine

## 2019-10-04 VITALS — BP 130/88 | HR 72 | Ht 70.0 in | Wt 223.0 lb

## 2019-10-04 DIAGNOSIS — L03114 Cellulitis of left upper limb: Secondary | ICD-10-CM | POA: Diagnosis not present

## 2019-10-04 MED ORDER — AMOXICILLIN-POT CLAVULANATE 875-125 MG PO TABS: 1.0000 | ORAL_TABLET | Freq: Two times a day (BID) | ORAL | 0 refills | Status: DC

## 2019-10-04 MED ORDER — MUPIROCIN 2 % EX OINT: 1.0000 "application " | TOPICAL_OINTMENT | Freq: Two times a day (BID) | CUTANEOUS | 1 refills | Status: AC

## 2019-10-04 NOTE — Progress Notes (Signed)
Date:  10/04/2019   Name:  [facility]   DOB:  Aug 01, 1970   MRN:  [id number]   Chief Complaint: Rash (bump under L) armpit- burns and has some places around the area)  Rash This is a chronic problem. The current episode started in the past 7 days (s/p zoster). The problem has been waxing and waning since onset. The affected locations include the left axilla. The rash is characterized by redness (excoriated). Associated with: zoster. Pertinent negatives include no cough, diarrhea, fever, shortness of breath or sore throat. The treatment provided moderate relief.    Lab Results  Component Value Date   CREATININE 0.82 06/04/2019   BUN 13 06/04/2019   NA 125 (L) 06/04/2019   K 3.5 06/04/2019   CL 93 (L) 06/04/2019   CO2 19 (L) 06/04/2019   Lab Results  Component Value Date   CHOL 202 (H) 02/06/2016   HDL 33 (L) 02/06/2016   LDLCALC 144 (H) 02/06/2016   TRIG 125 02/06/2016   CHOLHDL 6.1 (H) 02/06/2016   Lab Results  Component Value Date   TSH 2.699 01/02/2019   Lab Results  Component Value Date   HGBA1C 7.8 (H) 01/02/2019   Lab Results  Component Value Date   WBC 8.1 06/04/2019   HGB 14.9 06/04/2019   HCT 42.6 06/04/2019   MCV 87.7 06/04/2019   PLT 237 06/04/2019   Lab Results  Component Value Date   ALT 53 (H) 01/01/2019   AST 29 01/01/2019   ALKPHOS 41 01/01/2019   BILITOT 0.8 01/01/2019     Review of Systems  Constitutional: Negative for chills and fever.  HENT: Negative for drooling, ear discharge, ear pain and sore throat.   Respiratory: Negative for cough, shortness of breath and wheezing.   Cardiovascular: Negative for chest pain, palpitations and leg swelling.  Gastrointestinal: Negative for abdominal pain, blood in stool, constipation, diarrhea and nausea.  Endocrine: Negative for polydipsia.  Genitourinary: Negative for dysuria, frequency, hematuria and urgency.  Musculoskeletal: Negative for back pain, myalgias and neck pain.  Skin:  Positive for rash.  Allergic/Immunologic: Negative for environmental allergies.  Neurological: Negative for dizziness and headaches.  Hematological: Does not bruise/bleed easily.  Psychiatric/Behavioral: Negative for suicidal ideas. The patient is not nervous/anxious.     Patient Active Problem List   Diagnosis Date Noted  . DDD (degenerative disc disease), cervical 08/08/2017  . Type 2 diabetes mellitus without complication, without long-term current use of insulin ([place]) 07/27/2017  . Obesity (BMI 30.0-34.9) 03/11/2017  . Hyperlipidemia due to type 2 diabetes mellitus ([place]) 01/28/2017    No Known Allergies  Past Surgical History:  Procedure Laterality Date  . BREAST SURGERY Left    lumpectomy  . CHOLECYSTECTOMY    . KNEE ARTHROSCOPY Left     Social History   Tobacco Use  . Smoking status: Never Smoker  . Smokeless tobacco: Never Used  Substance Use Topics  . Alcohol use: Yes  . Drug use: No     Medication list has been reviewed and updated.  Current Meds  Medication Sig  . Dulaglutide (TRULICITY) 1.5 HE/5.2DP SOPN Inject 1.5 mg into the skin every Sunday.   . levETIRAcetam (KEPPRA) 500 MG tablet Take 1 tablet (500 mg total) by mouth 2 (two) times daily.  . meloxicam (MOBIC) 15 MG tablet Take 1 tablet (15 mg total) by mouth daily.  . metFORMIN (GLUCOPHAGE) 1000 MG tablet Take 1,000 mg by mouth 2 (two) times daily with a meal.   .  olmesartan (BENICAR) 20 MG tablet Take 1 tablet (20 mg total) by mouth daily.  [name] ULTRA test strip   . rosuvastatin (CRESTOR) 5 MG tablet Take 1 tablet (5 mg total) by mouth daily.    PHQ 2/9 Scores 08/10/2019 07/12/2019 06/21/2019 12/07/2018  PHQ - 2 Score 0 0 0 0  PHQ- 9 Score 0 4 9 0    GAD 7 : Generalized Anxiety Score 10/04/2019 08/10/2019 07/12/2019 06/21/2019  Nervous, Anxious, on Edge 0 0 0 0  Control/stop worrying 0 0 0 0  Worry too much - different things 0 0 0 0  Trouble relaxing 0 0 0 3  Restless 0 0 0 0  Easily annoyed  or irritable 0 0 0 0  Afraid - awful might happen 0 0 0 0  Total GAD 7 Score 0 0 0 3  Anxiety Difficulty - Not difficult at all Not difficult at all Not difficult at all    BP Readings from Last 3 Encounters:  10/04/19 130/88  09/17/19 (!) 142/98  08/10/19 132/78    Physical Exam Vitals and nursing note reviewed.  HENT:     Head: Normocephalic.     Right Ear: External ear normal.     Left Ear: External ear normal.     Nose: Nose normal.  Eyes:     General: No scleral icterus.       Right eye: No discharge.        Left eye: No discharge.     Conjunctiva/sclera: Conjunctivae normal.     Pupils: Pupils are equal, round, and reactive to light.  Neck:     Thyroid: No thyromegaly.     Vascular: No JVD.     Trachea: No tracheal deviation.  Cardiovascular:     Rate and Rhythm: Normal rate and regular rhythm.     Heart sounds: Normal heart sounds. No murmur heard.  No friction rub. No gallop.   Pulmonary:     Effort: No respiratory distress.     Breath sounds: Normal breath sounds. No wheezing or rales.  Abdominal:     General: Bowel sounds are normal.     Palpations: Abdomen is soft. There is no mass.     Tenderness: There is no abdominal tenderness. There is no guarding or rebound.  Musculoskeletal:        General: No tenderness. Normal range of motion.     Cervical back: Normal range of motion and neck supple.  Lymphadenopathy:     Cervical: No cervical adenopathy.  Skin:    General: Skin is warm.     Findings: Rash present. Rash is macular.     Comments: Excoriated area  Neurological:     Mental Status: He is alert and oriented to person, place, and time.     Cranial Nerves: No cranial nerve deficit.     Deep Tendon Reflexes: Reflexes are normal and symmetric.     Wt Readings from Last 3 Encounters:  10/04/19 223 lb (101.2 kg)  09/17/19 224 lb (101.6 kg)  08/10/19 221 lb (100.2 kg)    BP 130/88   Pulse 72   Ht 5\' 10"  (1.778 m)   Wt 223 lb (101.2 kg)   BMI  32.00 kg/m   Assessment and Plan: 1. Cellulitis of left upper extremity New onset.  Portion of previous zoster infection is involved with some excoriated areas in 2 areas which sustained when he uses an astringent like deodorant.  This appears to be more of  a excess cellulitis associated with areas that have been scratched or excoriated and we will use Bactroban 2% apply twice a day as well as Augmentin 875 mg twice a day. - mupirocin ointment (BACTROBAN) 2 %; Apply 1 application topically 2 (two) times daily.  Dispense: 22 g; Refill: 1 - amoxicillin-clavulanate (AUGMENTIN) 875-125 MG tablet; Take 1 tablet by mouth 2 (two) times daily.  Dispense: 20 tablet; Refill: 0

## 2019-10-05 DIAGNOSIS — E1169 Type 2 diabetes mellitus with other specified complication: Secondary | ICD-10-CM | POA: Diagnosis not present

## 2019-10-05 DIAGNOSIS — Z79899 Other long term (current) drug therapy: Secondary | ICD-10-CM | POA: Diagnosis not present

## 2019-10-05 DIAGNOSIS — E785 Hyperlipidemia, unspecified: Secondary | ICD-10-CM | POA: Diagnosis not present

## 2019-10-05 DIAGNOSIS — E1165 Type 2 diabetes mellitus with hyperglycemia: Secondary | ICD-10-CM | POA: Diagnosis not present

## 2019-10-05 LAB — HEMOGLOBIN A1C: Hemoglobin A1C: 8.1

## 2019-10-05 LAB — HM DIABETES FOOT EXAM: HM Diabetic Foot Exam: NORMAL

## 2019-10-11 DIAGNOSIS — M542 Cervicalgia: Secondary | ICD-10-CM | POA: Diagnosis not present

## 2019-10-11 DIAGNOSIS — M6281 Muscle weakness (generalized): Secondary | ICD-10-CM | POA: Diagnosis not present

## 2019-10-12 DIAGNOSIS — E1159 Type 2 diabetes mellitus with other circulatory complications: Secondary | ICD-10-CM | POA: Diagnosis not present

## 2019-10-12 DIAGNOSIS — E1165 Type 2 diabetes mellitus with hyperglycemia: Secondary | ICD-10-CM | POA: Diagnosis not present

## 2019-10-12 DIAGNOSIS — E538 Deficiency of other specified B group vitamins: Secondary | ICD-10-CM | POA: Diagnosis not present

## 2019-10-12 DIAGNOSIS — E1169 Type 2 diabetes mellitus with other specified complication: Secondary | ICD-10-CM | POA: Diagnosis not present

## 2019-11-01 ENCOUNTER — Encounter: Payer: Self-pay | Admitting: Family Medicine

## 2019-11-14 ENCOUNTER — Other Ambulatory Visit: Payer: Self-pay | Admitting: Family Medicine

## 2019-11-14 DIAGNOSIS — M5412 Radiculopathy, cervical region: Secondary | ICD-10-CM

## 2019-11-14 DIAGNOSIS — G5791 Unspecified mononeuropathy of right lower limb: Secondary | ICD-10-CM

## 2019-11-14 NOTE — Telephone Encounter (Signed)
Requested Prescriptions  Pending Prescriptions Disp Refills  . meloxicam (MOBIC) 15 MG tablet [Pharmacy Med Name: MELOXICAM 15 MG TABLET] 30 tablet 1    Sig: TAKE 1 TABLET BY MOUTH EVERY DAY     Analgesics:  COX2 Inhibitors Passed - 11/14/2019  1:23 AM      Passed - HGB in normal range and within 360 days    Hemoglobin  Date Value Ref Range Status  06/04/2019 14.9 13.0 - 17.0 g/dL Final   HGB  Date Value Ref Range Status  08/20/2013 15.9 13.0 - 18.0 g/dL Final         Passed - Cr in normal range and within 360 days    Creatinine  Date Value Ref Range Status  08/20/2013 1.06 0.60 - 1.30 mg/dL Final   Creatinine, Ser  Date Value Ref Range Status  06/04/2019 0.82 0.61 - 1.24 mg/dL Final         Passed - Patient is not pregnant      Passed - Valid encounter within last 12 months    Recent Outpatient Visits          1 month ago Cellulitis of left upper extremity   [facility] Juline Patch, MD   1 month ago Cervical radiculopathy   [facility] Juline Patch, MD   3 months ago Herpes zoster without complication   [facility] Juline Patch, MD   4 months ago Essential hypertension   [place], [place]   4 months ago Elevated blood pressure reading   [place], [place]      Future Appointments            In 2 months Juline Patch, MD [facility], [facility]

## 2019-11-27 ENCOUNTER — Encounter: Payer: Self-pay | Admitting: Family Medicine

## 2019-11-27 ENCOUNTER — Ambulatory Visit (INDEPENDENT_AMBULATORY_CARE_PROVIDER_SITE_OTHER): Payer: BC Managed Care – PPO | Admitting: Family Medicine

## 2019-11-27 ENCOUNTER — Other Ambulatory Visit: Payer: Self-pay

## 2019-11-27 VITALS — BP 144/84 | HR 60 | Ht 70.0 in | Wt 222.0 lb

## 2019-11-27 DIAGNOSIS — S161XXA Strain of muscle, fascia and tendon at neck level, initial encounter: Secondary | ICD-10-CM

## 2019-11-27 DIAGNOSIS — Z23 Encounter for immunization: Secondary | ICD-10-CM | POA: Diagnosis not present

## 2019-11-27 DIAGNOSIS — S39012A Strain of muscle, fascia and tendon of lower back, initial encounter: Secondary | ICD-10-CM | POA: Diagnosis not present

## 2019-11-27 DIAGNOSIS — M5412 Radiculopathy, cervical region: Secondary | ICD-10-CM | POA: Diagnosis not present

## 2019-11-27 MED ORDER — PREDNISONE 10 MG PO TABS: 10.0000 mg | ORAL_TABLET | Freq: Every day | ORAL | 0 refills | Status: DC

## 2019-11-27 MED ORDER — CYCLOBENZAPRINE HCL 10 MG PO TABS: 10.0000 mg | ORAL_TABLET | Freq: Every day | ORAL | 0 refills | Status: DC

## 2019-11-27 NOTE — Progress Notes (Signed)
Date:  11/27/2019   Name:  Marcus Baldwin [facility]   DOB:  Apr 13, 1970   MRN:  [id number]   Chief Complaint: Back Pain (going across back down right arm- R) hand goes numb) and flu vacc need  Back Pain This is a new problem. The current episode started 1 to 4 weeks ago. The problem occurs daily. The problem has been waxing and waning since onset. The pain is present in the lumbar spine. The quality of the pain is described as aching. The pain is mild. The symptoms are aggravated by bending and twisting. Pertinent negatives include no abdominal pain, chest pain, dysuria, fever, headaches, numbness or paresthesias. He has tried NSAIDs for the symptoms. The treatment provided mild relief.  Neck Pain  This is a new problem. The problem occurs intermittently. The problem has been waxing and waning. Associated with: roller coaster. The quality of the pain is described as aching. The pain is moderate. Pertinent negatives include no chest pain, fever, headaches or numbness.    Lab Results  Component Value Date   CREATININE 0.82 06/04/2019   BUN 13 06/04/2019   NA 125 (L) 06/04/2019   K 3.5 06/04/2019   CL 93 (L) 06/04/2019   CO2 19 (L) 06/04/2019   Lab Results  Component Value Date   CHOL 202 (H) 02/06/2016   HDL 33 (L) 02/06/2016   LDLCALC 144 (H) 02/06/2016   TRIG 125 02/06/2016   CHOLHDL 6.1 (H) 02/06/2016   Lab Results  Component Value Date   TSH 2.699 01/02/2019   Lab Results  Component Value Date   HGBA1C 8.1 10/05/2019   Lab Results  Component Value Date   WBC 8.1 06/04/2019   HGB 14.9 06/04/2019   HCT 42.6 06/04/2019   MCV 87.7 06/04/2019   PLT 237 06/04/2019   Lab Results  Component Value Date   ALT 53 (H) 01/01/2019   AST 29 01/01/2019   ALKPHOS 41 01/01/2019   BILITOT 0.8 01/01/2019     Review of Systems  Constitutional: Negative for chills and fever.  HENT: Negative for drooling, ear discharge, ear pain and sore throat.   Respiratory: Negative for  cough, shortness of breath and wheezing.   Cardiovascular: Negative for chest pain, palpitations and leg swelling.  Gastrointestinal: Negative for abdominal pain, blood in stool, constipation, diarrhea and nausea.  Endocrine: Negative for polydipsia.  Genitourinary: Negative for dysuria, frequency, hematuria and urgency.  Musculoskeletal: Positive for back pain and neck pain. Negative for myalgias.  Skin: Negative for rash.  Allergic/Immunologic: Negative for environmental allergies.  Neurological: Negative for dizziness, numbness, headaches and paresthesias.  Hematological: Does not bruise/bleed easily.  Psychiatric/Behavioral: Negative for suicidal ideas. The patient is not nervous/anxious.     Patient Active Problem List   Diagnosis Date Noted  . DDD (degenerative disc disease), cervical 08/08/2017  . Type 2 diabetes mellitus without complication, without long-term current use of insulin ([place]) 07/27/2017  . Obesity (BMI 30.0-34.9) 03/11/2017  . Hyperlipidemia due to type 2 diabetes mellitus ([place]) 01/28/2017    No Known Allergies  Past Surgical History:  Procedure Laterality Date  . BREAST SURGERY Left    lumpectomy  . CHOLECYSTECTOMY    . KNEE ARTHROSCOPY Left     Social History   Tobacco Use  . Smoking status: Never Smoker  . Smokeless tobacco: Never Used  Substance Use Topics  . Alcohol use: Yes  . Drug use: No     Medication list has been reviewed and updated.  Current Meds  Medication Sig  . Dulaglutide (TRULICITY) 1.5 UR/4.2HC SOPN Inject 3 mg into the skin every Sunday.   . levETIRAcetam (KEPPRA) 500 MG tablet Take 1 tablet (500 mg total) by mouth 2 (two) times daily.  . meloxicam (MOBIC) 15 MG tablet TAKE 1 TABLET BY MOUTH EVERY DAY  . metFORMIN (GLUCOPHAGE) 1000 MG tablet Take 1,000 mg by mouth 2 (two) times daily with a meal.   . mupirocin ointment (BACTROBAN) 2 % Apply 1 application topically 2 (two) times daily.  Marland Kitchen olmesartan (BENICAR) 20 MG tablet  Take 1 tablet (20 mg total) by mouth daily.  Marcus Baldwin ULTRA test strip   . rosuvastatin (CRESTOR) 5 MG tablet Take 1 tablet (5 mg total) by mouth daily. (Patient taking differently: Take 10 mg by mouth daily. )    PHQ 2/9 Scores 11/27/2019 08/10/2019 07/12/2019 06/21/2019  PHQ - 2 Score 0 0 0 0  PHQ- 9 Score 0 0 4 9    GAD 7 : Generalized Anxiety Score 11/27/2019 10/04/2019 08/10/2019 07/12/2019  Nervous, Anxious, on Edge 0 0 0 0  Control/stop worrying 0 0 0 0  Worry too much - different things 0 0 0 0  Trouble relaxing 0 0 0 0  Restless 0 0 0 0  Easily annoyed or irritable 0 0 0 0  Afraid - awful might happen 0 0 0 0  Total GAD 7 Score 0 0 0 0  Anxiety Difficulty - - Not difficult at all Not difficult at all    BP Readings from Last 3 Encounters:  11/27/19 (!) 144/84  10/04/19 130/88  09/17/19 (!) 142/98    Physical Exam Vitals and nursing note reviewed.  HENT:     Head: Normocephalic.     Right Ear: External ear normal.     Left Ear: External ear normal.     Nose: Nose normal.  Eyes:     General: No scleral icterus.       Right eye: No discharge.        Left eye: No discharge.     Conjunctiva/sclera: Conjunctivae normal.     Pupils: Pupils are equal, round, and reactive to light.  Neck:     Thyroid: No thyromegaly.     Vascular: No JVD.     Trachea: No tracheal deviation.  Cardiovascular:     Rate and Rhythm: Normal rate and regular rhythm.     Heart sounds: Normal heart sounds. No murmur heard.  No friction rub. No gallop.   Pulmonary:     Effort: No respiratory distress.     Breath sounds: Normal breath sounds. No wheezing or rales.  Abdominal:     General: Bowel sounds are normal.     Palpations: Abdomen is soft. There is no mass.     Tenderness: There is no abdominal tenderness. There is no guarding or rebound.  Musculoskeletal:        General: Normal range of motion.     Cervical back: Normal range of motion and neck supple. Spasms and tenderness present.      Lumbar back: Spasms and tenderness present. Normal range of motion.  Lymphadenopathy:     Cervical: No cervical adenopathy.  Skin:    General: Skin is warm.     Findings: No rash.  Neurological:     Mental Status: He is alert and oriented to person, place, and time.     Cranial Nerves: No cranial nerve deficit.     Deep Tendon Reflexes:  Reflexes are normal and symmetric.     Wt Readings from Last 3 Encounters:  11/27/19 222 lb (100.7 kg)  10/04/19 223 lb (101.2 kg)  09/17/19 224 lb (101.6 kg)    BP (!) 144/84   Pulse 60   Ht 5\' 10"  (1.778 m)   Wt 222 lb (100.7 kg)   BMI 31.85 kg/m   Assessment and Plan:  1. Strain of lumbar region, initial encounter Following a recent trip to [employer] patient has developed some discomfort in be upper and lower back presumably from carrying a heavy backpack.  This is continued and is now involving the cervical area as well we will initiate prednisone 10 mg taper 40 mg to 30-22 1 over a 12-day.  And cyclobenzaprine has been given 10 mg nightly.  Patient will continue meloxicam 15 mg once a day. - predniSONE (DELTASONE) 10 MG tablet; Take 1 tablet (10 mg total) by mouth daily with breakfast. Taper 4,4,4,3,3,3,2,2,2,1,1,1  Dispense: 30 tablet; Refill: 0 - cyclobenzaprine (FLEXERIL) 10 MG tablet; Take 1 tablet (10 mg total) by mouth at bedtime.  Dispense: 30 tablet; Refill: 0  2. Strain of neck muscle, initial encounter Patient is also noted some discomfort in the trapezius with some tenderness right and the left trapezius may be consistent with mild also strain.  As above will be used should take care of that as well. - predniSONE (DELTASONE) 10 MG tablet; Take 1 tablet (10 mg total) by mouth daily with breakfast. Taper 4,4,4,3,3,3,2,2,2,1,1,1  Dispense: 30 tablet; Refill: 0 - cyclobenzaprine (FLEXERIL) 10 MG tablet; Take 1 tablet (10 mg total) by mouth at bedtime.  Dispense: 30 tablet; Refill: 0  3. Cervical radiculopathy Review of 2019 x-ray  notes that there is degenerative changes as well as arthritic changes in the cervical area which could be consistent with radicular pain that is going particularly in the ulnar nerve of the right hand..  As noted above we will go with the prednisone taper in tandem with nonsteroidal anti-inflammatory therapy and cyclobenzaprine nightly. - predniSONE (DELTASONE) 10 MG tablet; Take 1 tablet (10 mg total) by mouth daily with breakfast. Taper 4,4,4,3,3,3,2,2,2,1,1,1  Dispense: 30 tablet; Refill: 0 - cyclobenzaprine (FLEXERIL) 10 MG tablet; Take 1 tablet (10 mg total) by mouth at bedtime.  Dispense: 30 tablet; Refill: 0  4. Influenza vaccine needed Patient received influenza and tolerated. - Flu Vaccine QUAD 6+ mos PF IM (Fluarix Quad PF)

## 2019-11-28 DIAGNOSIS — D1800 Hemangioma unspecified site: Secondary | ICD-10-CM | POA: Diagnosis not present

## 2019-11-28 DIAGNOSIS — R569 Unspecified convulsions: Secondary | ICD-10-CM | POA: Diagnosis not present

## 2019-11-28 DIAGNOSIS — R9089 Other abnormal findings on diagnostic imaging of central nervous system: Secondary | ICD-10-CM | POA: Diagnosis not present

## 2019-11-28 DIAGNOSIS — R200 Anesthesia of skin: Secondary | ICD-10-CM | POA: Diagnosis not present

## 2019-11-28 LAB — HM DIABETES EYE EXAM

## 2020-01-12 ENCOUNTER — Other Ambulatory Visit: Payer: Self-pay | Admitting: Family Medicine

## 2020-01-12 DIAGNOSIS — G5791 Unspecified mononeuropathy of right lower limb: Secondary | ICD-10-CM

## 2020-01-12 DIAGNOSIS — M5412 Radiculopathy, cervical region: Secondary | ICD-10-CM

## 2020-01-12 NOTE — Telephone Encounter (Signed)
Requested Prescriptions  Pending Prescriptions Disp Refills  . meloxicam (MOBIC) 15 MG tablet [Pharmacy Med Name: MELOXICAM 15 MG TABLET] 30 tablet 0    Sig: TAKE 1 TABLET BY MOUTH EVERY DAY     Analgesics:  COX2 Inhibitors Passed - 01/12/2020  8:52 AM      Passed - HGB in normal range and within 360 days    Hemoglobin  Date Value Ref Range Status  06/04/2019 14.9 13.0 - 17.0 g/dL Final   HGB  Date Value Ref Range Status  08/20/2013 15.9 13.0 - 18.0 g/dL Final         Passed - Cr in normal range and within 360 days    Creatinine  Date Value Ref Range Status  08/20/2013 1.06 0.60 - 1.30 mg/dL Final   Creatinine, Ser  Date Value Ref Range Status  06/04/2019 0.82 0.61 - 1.24 mg/dL Final         Passed - Patient is not pregnant      Passed - Valid encounter within last 12 months    Recent Outpatient Visits          1 month ago Strain of lumbar region, initial encounter   [facility] Juline Patch, MD   3 months ago Cellulitis of left upper extremity   [facility] Juline Patch, MD   3 months ago Cervical radiculopathy   [facility] Juline Patch, MD   5 months ago Herpes zoster without complication   [facility] Juline Patch, MD   6 months ago Essential hypertension   [facility] Juline Patch, MD

## 2020-01-15 ENCOUNTER — Ambulatory Visit: Payer: BC Managed Care – PPO | Admitting: Family Medicine

## 2020-01-24 ENCOUNTER — Encounter: Payer: Self-pay | Admitting: Family Medicine

## 2020-02-05 DIAGNOSIS — Z03818 Encounter for observation for suspected exposure to other biological agents ruled out: Secondary | ICD-10-CM | POA: Diagnosis not present

## 2020-02-05 DIAGNOSIS — Z20822 Contact with and (suspected) exposure to covid-19: Secondary | ICD-10-CM | POA: Diagnosis not present

## 2020-02-09 ENCOUNTER — Other Ambulatory Visit: Payer: Self-pay | Admitting: Family Medicine

## 2020-02-09 DIAGNOSIS — G5791 Unspecified mononeuropathy of right lower limb: Secondary | ICD-10-CM

## 2020-02-09 DIAGNOSIS — M5412 Radiculopathy, cervical region: Secondary | ICD-10-CM

## 2020-02-09 NOTE — Telephone Encounter (Signed)
Requested Prescriptions  Pending Prescriptions Disp Refills   meloxicam (MOBIC) 15 MG tablet [Pharmacy Med Name: MELOXICAM 15 MG TABLET] 30 tablet 0    Sig: TAKE 1 TABLET BY MOUTH EVERY DAY     Analgesics:  COX2 Inhibitors Passed - 02/09/2020  9:15 AM      Passed - HGB in normal range and within 360 days    Hemoglobin  Date Value Ref Range Status  06/04/2019 14.9 13.0 - 17.0 g/dL Final   HGB  Date Value Ref Range Status  08/20/2013 15.9 13.0 - 18.0 g/dL Final         Passed - Cr in normal range and within 360 days    Creatinine  Date Value Ref Range Status  08/20/2013 1.06 0.60 - 1.30 mg/dL Final   Creatinine, Ser  Date Value Ref Range Status  06/04/2019 0.82 0.61 - 1.24 mg/dL Final         Passed - Patient is not pregnant      Passed - Valid encounter within last 12 months    Recent Outpatient Visits          2 months ago Strain of lumbar region, initial encounter   [facility] Juline Patch, MD   4 months ago Cellulitis of left upper extremity   [facility] Juline Patch, MD   4 months ago Cervical radiculopathy   [facility] Juline Patch, MD   6 months ago Herpes zoster without complication   [facility] Juline Patch, MD   7 months ago Essential hypertension   [facility] Juline Patch, MD

## 2020-03-04 ENCOUNTER — Encounter: Payer: Self-pay | Admitting: Family Medicine

## 2020-03-04 ENCOUNTER — Other Ambulatory Visit: Payer: Self-pay

## 2020-03-04 ENCOUNTER — Ambulatory Visit (INDEPENDENT_AMBULATORY_CARE_PROVIDER_SITE_OTHER): Payer: BC Managed Care – PPO | Admitting: Family Medicine

## 2020-03-04 VITALS — BP 138/80 | HR 76 | Ht 70.0 in | Wt 226.0 lb

## 2020-03-04 DIAGNOSIS — M1711 Unilateral primary osteoarthritis, right knee: Secondary | ICD-10-CM

## 2020-03-04 DIAGNOSIS — Z23 Encounter for immunization: Secondary | ICD-10-CM | POA: Diagnosis not present

## 2020-03-04 DIAGNOSIS — M222X1 Patellofemoral disorders, right knee: Secondary | ICD-10-CM

## 2020-03-04 NOTE — Patient Instructions (Signed)
Patellofemoral Pain Syndrome  Patellofemoral pain syndrome is a condition in which the tissue (cartilage) on the underside of the kneecap (patella) softens or breaks down. This causes pain in the front of the knee. The condition is also called runner's knee or chondromalacia patella. Patellofemoral pain syndrome is most common in young adults who are active in sports. The knee is the largest joint in the body. The patella covers the front of the knee and is attached to muscles above and below the knee. The underside of the patella is covered with a smooth type of cartilage (synovium). The smooth surface helps the patella to glide easily when you move your knee. Patellofemoral pain syndrome causes swelling in the joint linings and bone surfaces in the knee. What are the causes? This condition may be caused by:  Overuse of the knee.  Poor alignment of your knee joints.  Weak leg muscles.  A direct blow to your kneecap. What increases the risk? You are more likely to develop this condition if:  You do a lot of activities that can wear down your kneecap. These include: ? Running. ? Squatting. ? Climbing stairs.  You start a new physical activity or exercise program.  You wear shoes that do not fit well.  You do not have good leg strength.  You are overweight. What are the signs or symptoms? The main symptom of this condition is knee pain. This may feel like a dull, aching pain underneath your patella, in the front of your knee. There may be a popping or cracking sound when you move your knee. Pain may get worse with:  Exercise.  Climbing stairs.  Running.  Jumping.  Squatting.  Kneeling.  Sitting for a long time.  Moving or pushing on your patella. How is this diagnosed? This condition may be diagnosed based on:  Your symptoms and medical history. You may be asked about your recent physical activities and which ones cause knee pain.  A physical exam. This may  include: ? Moving your patella back and forth. ? Checking your range of knee motion. ? Having you squat or jump to see if you have pain. ? Checking the strength of your leg muscles.  Imaging tests to confirm the diagnosis. These may include an MRI of your knee. How is this treated? This condition may be treated at home with rest, ice, compression, and elevation (RICE).  Other treatments may include:  Nonsteroidal anti-inflammatory drugs (NSAIDs).  Physical therapy to stretch and strengthen your leg muscles.  Shoe inserts (orthotics) to take stress off your knee.  A knee brace or knee support.  Adhesive tapes to the skin.  Surgery to remove damaged cartilage or move the patella to a better position. This is rare. Follow these instructions at home: If you have a shoe or brace:  Wear the shoe or brace as told by your health care provider. Remove it only as told by your health care provider.  Loosen the shoe or brace if your toes tingle, become numb, or turn cold and blue.  Keep the shoe or brace clean.  If the shoe or brace is not waterproof: ? Do not let it get wet. ? Cover it with a watertight covering when you take a bath or a shower. Managing pain, stiffness, and swelling  If directed, put ice on the painful area. ? If you have a removable shoe or brace, remove it as told by your health care provider. ? Put ice in a plastic bag. ?  Place a towel between your skin and the bag. ? Leave the ice on for 20 minutes, 2-3 times a day.  Move your toes often to avoid stiffness and to lessen swelling.  Rest your knee: ? Avoid activities that cause knee pain. ? When sitting or lying down, raise (elevate) the injured area above the level of your heart, whenever possible. General instructions  Take over-the-counter and prescription medicines only as told by your health care provider.  Use splints, braces, knee supports, or walking aids as directed by your health care  provider.  Perform stretching and strengthening exercises as told by your health care provider or physical therapist.  Do not use any products that contain nicotine or tobacco, such as cigarettes and e-cigarettes. These can delay healing. If you need help quitting, ask your health care provider.  Return to your normal activities as told by your health care provider. Ask your health care provider what activities are safe for you.  Keep all follow-up visits as told by your health care provider. This is important. Contact a health care provider if:  Your symptoms get worse.  You are not improving with home care. Summary  Patellofemoral pain syndrome is a condition in which the tissue (cartilage) on the underside of the kneecap (patella) softens or breaks down.  This condition causes swelling in the joint linings and bone surfaces in the knee. This leads to pain in the front of the knee.  This condition may be treated at home with rest, ice, compression, and elevation (RICE).  Use splints, braces, knee supports, or walking aids as directed by your health care provider. This information is not intended to replace advice given to you by your health care provider. Make sure you discuss any questions you have with your health care provider. Document Revised: 04/25/2017 Document Reviewed: 04/25/2017 Elsevier Patient Education  2020 [employer].

## 2020-03-04 NOTE — Progress Notes (Signed)
Date:  03/04/2020   Name:  Marcus Baldwin [facility]   DOB:  09-24-1970   MRN:  [id number]   Chief Complaint: Knee Pain (popping noise in knee when sitting down ) and shingles vax (1st dose)  Knee Pain  The incident occurred 5 to 7 days ago. There was no injury mechanism. The pain is present in the right knee (popping/crackeling). The pain is mild. The pain has been intermittent since onset. Pertinent negatives include no inability to bear weight, loss of motion, loss of sensation, muscle weakness, numbness or tingling. The symptoms are aggravated by movement.    Lab Results  Component Value Date   CREATININE 0.82 06/04/2019   BUN 13 06/04/2019   NA 125 (L) 06/04/2019   K 3.5 06/04/2019   CL 93 (L) 06/04/2019   CO2 19 (L) 06/04/2019   Lab Results  Component Value Date   CHOL 202 (H) 02/06/2016   HDL 33 (L) 02/06/2016   LDLCALC 144 (H) 02/06/2016   TRIG 125 02/06/2016   CHOLHDL 6.1 (H) 02/06/2016   Lab Results  Component Value Date   TSH 2.699 01/02/2019   Lab Results  Component Value Date   HGBA1C 8.1 10/05/2019   Lab Results  Component Value Date   WBC 8.1 06/04/2019   HGB 14.9 06/04/2019   HCT 42.6 06/04/2019   MCV 87.7 06/04/2019   PLT 237 06/04/2019   Lab Results  Component Value Date   ALT 53 (H) 01/01/2019   AST 29 01/01/2019   ALKPHOS 41 01/01/2019   BILITOT 0.8 01/01/2019     Review of Systems  Constitutional: Negative for chills and fever.  HENT: Negative for drooling, ear discharge, ear pain and sore throat.   Respiratory: Negative for cough, shortness of breath and wheezing.   Cardiovascular: Negative for chest pain, palpitations and leg swelling.  Gastrointestinal: Negative for abdominal pain, blood in stool, constipation, diarrhea and nausea.  Endocrine: Negative for polydipsia.  Genitourinary: Negative for dysuria, frequency, hematuria and urgency.  Musculoskeletal: Negative for back pain, myalgias and neck pain.  Skin: Negative for rash.    Allergic/Immunologic: Negative for environmental allergies.  Neurological: Negative for dizziness, tingling, numbness and headaches.  Hematological: Does not bruise/bleed easily.  Psychiatric/Behavioral: Negative for suicidal ideas. The patient is not nervous/anxious.     Patient Active Problem List   Diagnosis Date Noted  . DDD (degenerative disc disease), cervical 08/08/2017  . Type 2 diabetes mellitus without complication, without long-term current use of insulin ([place]) 07/27/2017  . Obesity (BMI 30.0-34.9) 03/11/2017  . Hyperlipidemia due to type 2 diabetes mellitus ([place]) 01/28/2017    No Known Allergies  Past Surgical History:  Procedure Laterality Date  . BREAST SURGERY Left    lumpectomy  . CHOLECYSTECTOMY    . KNEE ARTHROSCOPY Left     Social History   Tobacco Use  . Smoking status: Never Smoker  . Smokeless tobacco: Never Used  Substance Use Topics  . Alcohol use: Yes  . Drug use: No     Medication list has been reviewed and updated.  Current Meds  Medication Sig  . Dulaglutide (TRULICITY) 1.5 JK/9.3OI SOPN Inject 3 mg into the skin every Sunday.   . gabapentin (NEURONTIN) 100 MG capsule neuro  . levETIRAcetam (KEPPRA) 500 MG tablet Take 1 tablet (500 mg total) by mouth 2 (two) times daily.  . metFORMIN (GLUCOPHAGE) 1000 MG tablet Take 1,000 mg by mouth 2 (two) times daily with a meal.   . mupirocin  ointment (BACTROBAN) 2 % Apply 1 application topically 2 (two) times daily.  Marland Kitchen olmesartan (BENICAR) 20 MG tablet Take 1 tablet (20 mg total) by mouth daily.  Marcus Baldwin ULTRA test strip   . rosuvastatin (CRESTOR) 5 MG tablet Take 1 tablet (5 mg total) by mouth daily. (Patient taking differently: Take 10 mg by mouth daily. )  . [DISCONTINUED] cyclobenzaprine (FLEXERIL) 10 MG tablet Take 1 tablet (10 mg total) by mouth at bedtime.  . [DISCONTINUED] meloxicam (MOBIC) 15 MG tablet TAKE 1 TABLET BY MOUTH EVERY DAY    PHQ 2/9 Scores 03/04/2020 11/27/2019 08/10/2019  07/12/2019  PHQ - 2 Score 0 0 0 0  PHQ- 9 Score 0 0 0 4    GAD 7 : Generalized Anxiety Score 03/04/2020 11/27/2019 10/04/2019 08/10/2019  Nervous, Anxious, on Edge 0 0 0 0  Control/stop worrying 0 0 0 0  Worry too much - different things 0 0 0 0  Trouble relaxing 0 0 0 0  Restless 0 0 0 0  Easily annoyed or irritable 0 0 0 0  Afraid - awful might happen 0 0 0 0  Total GAD 7 Score 0 0 0 0  Anxiety Difficulty - - - Not difficult at all    BP Readings from Last 3 Encounters:  03/04/20 138/80  11/27/19 (!) 144/84  10/04/19 130/88    Physical Exam Vitals and nursing note reviewed.  HENT:     Head: Normocephalic.     Right Ear: External ear normal.     Left Ear: External ear normal.     Nose: Nose normal.     Mouth/Throat:     Mouth: Mucous membranes are moist.  Eyes:     General: No scleral icterus.       Right eye: No discharge.        Left eye: No discharge.     Conjunctiva/sclera: Conjunctivae normal.     Pupils: Pupils are equal, round, and reactive to light.  Neck:     Thyroid: No thyromegaly.     Vascular: No JVD.     Trachea: No tracheal deviation.  Cardiovascular:     Rate and Rhythm: Normal rate and regular rhythm.     Heart sounds: Normal heart sounds. No murmur heard.  No friction rub. No gallop.   Pulmonary:     Effort: No respiratory distress.     Breath sounds: Normal breath sounds. No wheezing, rhonchi or rales.  Abdominal:     General: Bowel sounds are normal.     Palpations: Abdomen is soft. There is no mass.     Tenderness: There is no abdominal tenderness. There is no guarding or rebound.  Musculoskeletal:        General: Normal range of motion.     Cervical back: Normal range of motion and neck supple.     Right knee: Crepitus present. No effusion or erythema. Normal range of motion. Tenderness present over the medial joint line. No lateral joint line, MCL, LCL or ACL tenderness.  Lymphadenopathy:     Cervical: No cervical adenopathy.  Skin:     General: Skin is warm.     Capillary Refill: Capillary refill takes less than 2 seconds.     Findings: No rash.  Neurological:     Mental Status: He is alert and oriented to person, place, and time.     Cranial Nerves: No cranial nerve deficit.     Deep Tendon Reflexes: Reflexes are normal and symmetric.  Wt Readings from Last 3 Encounters:  03/04/20 226 lb (102.5 kg)  11/27/19 222 lb (100.7 kg)  10/04/19 223 lb (101.2 kg)    BP 138/80   Pulse 76   Ht 5\' 10"  (1.778 m)   Wt 226 lb (102.5 kg)   BMI 32.43 kg/m   Assessment and Plan: 1. Primary osteoarthritis of right knee New onset.  Persistent.  Stable.  Patient really has not had pain but there is some tenderness of the medial joint line.  There is no swelling or effusion.  I rather doubt that this is osteoarthritis but may be some irritation of the meniscus of the medial aspect.  Will refer to orthopedic if there is failure of NSAIDs.  2. Patellofemoral pain syndrome of right knee Patient is more likely to have patellofemoral pain syndrome with crepitance and popping when going from standing to flexing of the knee.  Will refer to orthopedics as needed.  3. Need for shingles vaccine Discussed and administered. - Varicella-zoster vaccine IM

## 2020-03-09 ENCOUNTER — Other Ambulatory Visit: Payer: Self-pay | Admitting: Family Medicine

## 2020-03-09 DIAGNOSIS — M5412 Radiculopathy, cervical region: Secondary | ICD-10-CM

## 2020-03-09 DIAGNOSIS — G5791 Unspecified mononeuropathy of right lower limb: Secondary | ICD-10-CM

## 2020-04-15 ENCOUNTER — Ambulatory Visit: Payer: BC Managed Care – PPO | Admitting: Family Medicine

## 2020-04-15 ENCOUNTER — Other Ambulatory Visit: Payer: Self-pay

## 2020-04-15 ENCOUNTER — Encounter: Payer: Self-pay | Admitting: Family Medicine

## 2020-04-15 VITALS — BP 120/80 | HR 72 | Ht 70.0 in | Wt 222.0 lb

## 2020-04-15 DIAGNOSIS — F5102 Adjustment insomnia: Secondary | ICD-10-CM | POA: Diagnosis not present

## 2020-04-15 DIAGNOSIS — F4323 Adjustment disorder with mixed anxiety and depressed mood: Secondary | ICD-10-CM | POA: Diagnosis not present

## 2020-04-15 MED ORDER — HYDROXYZINE HCL 10 MG PO TABS: 10.0000 mg | ORAL_TABLET | Freq: Every evening | ORAL | 1 refills | Status: DC

## 2020-04-15 MED ORDER — SERTRALINE HCL 25 MG PO TABS: 25.0000 mg | ORAL_TABLET | Freq: Every day | ORAL | 0 refills | Status: DC

## 2020-04-15 NOTE — Progress Notes (Signed)
Date:  04/15/2020   Name:  Marcus Baldwin   DOB:  26-Sep-1970   MRN:  [id number]   Chief Complaint: Anxiety (4 and 4- "been getting worse since Feb 2020"- feels it is job related.  )  Anxiety Presents for initial visit. Onset was 1 to 5 years ago (2020). The problem has been waxing and waning. Symptoms include excessive worry, irritability and nervous/anxious behavior. Patient reports no chest pain, compulsions, confusion, decreased concentration, depressed mood, dizziness, dry mouth, feeling of choking, hyperventilation, impotence, insomnia, malaise, muscle tension, nausea, obsessions, palpitations, panic, restlessness, shortness of breath or suicidal ideas. Symptoms occur occasionally. The severity of symptoms is moderate.   Risk factors include a major life event. Past treatments include nothing. The treatment provided mild relief. Compliance with prior treatments has been variable.  Insomnia Primary symptoms: fragmented sleep, difficulty falling asleep.  The onset quality is gradual. The problem has been waxing and waning since onset.    Lab Results  Component Value Date   CREATININE 0.82 06/04/2019   BUN 13 06/04/2019   NA 125 (L) 06/04/2019   K 3.5 06/04/2019   CL 93 (L) 06/04/2019   CO2 19 (L) 06/04/2019   Lab Results  Component Value Date   CHOL 202 (H) 02/06/2016   HDL 33 (L) 02/06/2016   LDLCALC 144 (H) 02/06/2016   TRIG 125 02/06/2016   CHOLHDL 6.1 (H) 02/06/2016   Lab Results  Component Value Date   TSH 2.699 01/02/2019   Lab Results  Component Value Date   HGBA1C 8.1 10/05/2019   Lab Results  Component Value Date   WBC 8.1 06/04/2019   HGB 14.9 06/04/2019   HCT 42.6 06/04/2019   MCV 87.7 06/04/2019   PLT 237 06/04/2019   Lab Results  Component Value Date   ALT 53 (H) 01/01/2019   AST 29 01/01/2019   ALKPHOS 41 01/01/2019   BILITOT 0.8 01/01/2019     Review of Systems  Constitutional: Positive for irritability. Negative for chills and  fever.  HENT: Negative for drooling, ear discharge, ear pain and sore throat.   Respiratory: Negative for cough, shortness of breath and wheezing.   Cardiovascular: Negative for chest pain, palpitations and leg swelling.  Gastrointestinal: Negative for abdominal pain, blood in stool, constipation, diarrhea and nausea.  Endocrine: Negative for polydipsia.  Genitourinary: Negative for dysuria, frequency, hematuria, impotence and urgency.  Musculoskeletal: Negative for back pain, myalgias and neck pain.  Skin: Negative for rash.  Allergic/Immunologic: Negative for environmental allergies.  Neurological: Negative for dizziness and headaches.  Hematological: Does not bruise/bleed easily.  Psychiatric/Behavioral: Negative for confusion, decreased concentration and suicidal ideas. The patient is nervous/anxious. The patient does not have insomnia.     Patient Active Problem List   Diagnosis Date Noted  . DDD (degenerative disc disease), cervical 08/08/2017  . Type 2 diabetes mellitus without complication, without long-term current use of insulin ([place]) 07/27/2017  . Obesity (BMI 30.0-34.9) 03/11/2017  . Hyperlipidemia due to type 2 diabetes mellitus ([place]) 01/28/2017    No Known Allergies  Past Surgical History:  Procedure Laterality Date  . BREAST SURGERY Left    lumpectomy  . CHOLECYSTECTOMY    . KNEE ARTHROSCOPY Left     Social History   Tobacco Use  . Smoking status: Never Smoker  . Smokeless tobacco: Never Used  Substance Use Topics  . Alcohol use: Yes  . Drug use: No     Medication list has been reviewed and updated.  Current Meds  Medication Sig  . Dulaglutide 1.5 MG/0.5ML SOPN Inject 3 mg into the skin every Sunday.   . gabapentin (NEURONTIN) 100 MG capsule neuro  . levETIRAcetam (KEPPRA) 500 MG tablet Take 1 tablet (500 mg total) by mouth 2 (two) times daily.  . metFORMIN (GLUCOPHAGE) 1000 MG tablet Take 1,000 mg by mouth 2 (two) times daily with a meal.   .  mupirocin ointment (BACTROBAN) 2 % Apply 1 application topically 2 (two) times daily.  Marland Kitchen olmesartan (BENICAR) 20 MG tablet Take 1 tablet (20 mg total) by mouth daily.  Marcus Baldwin ULTRA test strip   . rosuvastatin (CRESTOR) 5 MG tablet Take 1 tablet (5 mg total) by mouth daily. (Patient taking differently: Take 10 mg by mouth daily.)    PHQ 2/9 Scores 04/15/2020 03/04/2020 11/27/2019 08/10/2019  PHQ - 2 Score 2 0 0 0  PHQ- 9 Score 4 0 0 0    GAD 7 : Generalized Anxiety Score 04/15/2020 03/04/2020 11/27/2019 10/04/2019  Nervous, Anxious, on Edge 0 0 0 0  Control/stop worrying 1 0 0 0  Worry too much - different things 1 0 0 0  Trouble relaxing 0 0 0 0  Restless 0 0 0 0  Easily annoyed or irritable 1 0 0 0  Afraid - awful might happen 1 0 0 0  Total GAD 7 Score 4 0 0 0  Anxiety Difficulty Not difficult at all - - -    BP Readings from Last 3 Encounters:  04/15/20 120/80  03/04/20 138/80  11/27/19 (!) 144/84    Physical Exam Vitals and nursing note reviewed.  HENT:     Head: Normocephalic.     Right Ear: External ear normal.     Left Ear: External ear normal.     Nose: Nose normal.     Mouth/Throat:     Mouth: Oropharynx is clear and moist.  Eyes:     General: No scleral icterus.       Right eye: No discharge.        Left eye: No discharge.     Extraocular Movements: EOM normal.     Conjunctiva/sclera: Conjunctivae normal.     Pupils: Pupils are equal, round, and reactive to light.  Neck:     Thyroid: No thyromegaly.     Vascular: No JVD.     Trachea: No tracheal deviation.  Cardiovascular:     Rate and Rhythm: Normal rate and regular rhythm.     Pulses: Intact distal pulses.     Heart sounds: Normal heart sounds. No murmur heard. No friction rub. No gallop.   Pulmonary:     Effort: No respiratory distress.     Breath sounds: Normal breath sounds. No wheezing, rhonchi or rales.  Abdominal:     General: Bowel sounds are normal.     Palpations: Abdomen is soft. There is no  hepatosplenomegaly or mass.     Tenderness: There is no abdominal tenderness. There is no CVA tenderness, guarding or rebound.  Musculoskeletal:        General: No tenderness or edema. Normal range of motion.     Cervical back: Normal range of motion and neck supple.  Lymphadenopathy:     Cervical: No cervical adenopathy.  Skin:    General: Skin is warm.     Findings: No rash.  Neurological:     Mental Status: He is alert and oriented to person, place, and time.     Cranial Nerves: No cranial  nerve deficit.     Deep Tendon Reflexes: Strength normal and reflexes are normal and symmetric.     Wt Readings from Last 3 Encounters:  04/15/20 222 lb (100.7 kg)  03/04/20 226 lb (102.5 kg)  11/27/19 222 lb (100.7 kg)    BP 120/80   Pulse 72   Ht 5\' 10"  (1.778 m)   Wt 222 lb (100.7 kg)   BMI 31.85 kg/m   Assessment and Plan: 1. Adjustment reaction with anxiety and depression New onset.  PHQ score 4 Gad score is 4 patient has a lot of new issues involving employment and general health.  Patient was told that the medication could develop suicidal ideas at which time he is to immediately stop the medication tell his wife and call 911 or the office if necessary otherwise patient is also been instructed to remove access to guns while he is initiating this medication patient will be started on sertraline 25 mg 1 daily and will be rechecked in 6 weeks - sertraline (ZOLOFT) 25 MG tablet; Take 1 tablet (25 mg total) by mouth daily.  Dispense: 60 tablet; Refill: 0  2. Adjustment insomnia Patient has adjustment insomnia as well for which we will initiate hydroxyzine 10 mg 1 nightly - hydrOXYzine (ATARAX/VISTARIL) 10 MG tablet; Take 1 tablet (10 mg total) by mouth at bedtime.  Dispense: 30 tablet; Refill: 1

## 2020-04-15 NOTE — Patient Instructions (Signed)

## 2020-04-23 ENCOUNTER — Encounter: Payer: Self-pay | Admitting: Family Medicine

## 2020-04-28 ENCOUNTER — Ambulatory Visit (INDEPENDENT_AMBULATORY_CARE_PROVIDER_SITE_OTHER): Payer: BC Managed Care – PPO | Admitting: Urology

## 2020-04-28 ENCOUNTER — Other Ambulatory Visit: Payer: Self-pay

## 2020-04-28 ENCOUNTER — Ambulatory Visit
Admission: RE | Admit: 2020-04-28 | Discharge: 2020-04-28 | Disposition: A | Discharge status: Home / Self Care | Payer: BC Managed Care – PPO | Source: Ambulatory Visit | Attending: Urology | Admitting: Urology

## 2020-04-28 ENCOUNTER — Encounter: Payer: Self-pay | Admitting: Family Medicine

## 2020-04-28 ENCOUNTER — Ambulatory Visit (INDEPENDENT_AMBULATORY_CARE_PROVIDER_SITE_OTHER): Payer: BC Managed Care – PPO | Admitting: Family Medicine

## 2020-04-28 VITALS — BP 138/80 | HR 68 | Ht 70.0 in | Wt 222.0 lb

## 2020-04-28 VITALS — BP 179/88 | HR 102 | Ht 69.0 in | Wt 221.0 lb

## 2020-04-28 DIAGNOSIS — R109 Unspecified abdominal pain: Secondary | ICD-10-CM | POA: Diagnosis not present

## 2020-04-28 DIAGNOSIS — M545 Low back pain, unspecified: Secondary | ICD-10-CM

## 2020-04-28 DIAGNOSIS — K760 Fatty (change of) liver, not elsewhere classified: Secondary | ICD-10-CM | POA: Diagnosis not present

## 2020-04-28 DIAGNOSIS — R351 Nocturia: Secondary | ICD-10-CM | POA: Diagnosis not present

## 2020-04-28 DIAGNOSIS — N410 Acute prostatitis: Secondary | ICD-10-CM

## 2020-04-28 DIAGNOSIS — R350 Frequency of micturition: Secondary | ICD-10-CM

## 2020-04-28 DIAGNOSIS — N401 Enlarged prostate with lower urinary tract symptoms: Secondary | ICD-10-CM | POA: Diagnosis not present

## 2020-04-28 DIAGNOSIS — E119 Type 2 diabetes mellitus without complications: Secondary | ICD-10-CM

## 2020-04-28 DIAGNOSIS — Z87442 Personal history of urinary calculi: Secondary | ICD-10-CM

## 2020-04-28 DIAGNOSIS — R339 Retention of urine, unspecified: Secondary | ICD-10-CM

## 2020-04-28 DIAGNOSIS — Z9049 Acquired absence of other specified parts of digestive tract: Secondary | ICD-10-CM | POA: Diagnosis not present

## 2020-04-28 LAB — BLADDER SCAN AMB NON-IMAGING: Scan Result: 164

## 2020-04-28 LAB — URINALYSIS, COMPLETE
Bilirubin, UA: NEGATIVE
Glucose, UA: NEGATIVE
Ketones, UA: NEGATIVE
Leukocytes,UA: NEGATIVE
Nitrite, UA: NEGATIVE
Protein,UA: NEGATIVE
RBC, UA: NEGATIVE
Specific Gravity, UA: 1.02 (ref 1.005–1.030)
Urobilinogen, Ur: 0.2 mg/dL (ref 0.2–1.0)
pH, UA: 5 (ref 5.0–7.5)

## 2020-04-28 LAB — MICROSCOPIC EXAMINATION
Bacteria, UA: NONE SEEN
Epithelial Cells (non renal): NONE SEEN /hpf (ref 0–10)
WBC, UA: NONE SEEN /hpf (ref 0–5)

## 2020-04-28 LAB — GLUCOSE, POCT (MANUAL RESULT ENTRY): POC Glucose: 186 mg/dl — AB (ref 70–99)

## 2020-04-28 MED ORDER — TAMSULOSIN HCL 0.4 MG PO CAPS: 0.4000 mg | ORAL_CAPSULE | Freq: Every day | ORAL | 11 refills | Status: DC

## 2020-04-28 NOTE — Progress Notes (Addendum)
Date:  04/28/2020   Name:  Marcus Baldwin [facility]   DOB:  1970-05-15   MRN:  [id number]   Chief Complaint: Back Pain (L) lower back pain- having pain radiating to the front groin. Having difficulty keeping a steady stream and getting up at night to urinate)  Back Pain This is a new problem. The current episode started in the past 7 days. The problem occurs daily. The problem has been gradually worsening since onset. The pain is present in the lumbar spine. The quality of the pain is described as aching. Radiates to: left groin. The pain is at a severity of 5/10. The pain is moderate. Pertinent negatives include no abdominal pain, bladder incontinence, bowel incontinence, chest pain, dysuria, fever, headaches, numbness, paresthesias or tingling. He has tried NSAIDs for the symptoms. The treatment provided mild relief.  Urinary Frequency  This is a new problem. The current episode started 1 to 4 weeks ago. The problem occurs intermittently. The problem has been gradually improving. The pain is moderate. Associated symptoms include flank pain, frequency and hesitancy. Pertinent negatives include no chills, discharge, hematuria, nausea, sweats, urgency or vomiting. The treatment provided moderate relief.  Benign Prostatic Hypertrophy This is a chronic problem. The current episode started 1 to 4 weeks ago. The problem has been gradually improving since onset. Irritative symptoms include frequency and nocturia. Irritative symptoms do not include urgency. Obstructive symptoms include incomplete emptying, an intermittent stream, a slower stream and a weak stream. Associated symptoms include hesitancy. Pertinent negatives include no chills, dysuria, hematuria, nausea or vomiting. Treatments tried: drinking water.    Lab Results  Component Value Date   CREATININE 0.82 06/04/2019   BUN 13 06/04/2019   NA 125 (L) 06/04/2019   K 3.5 06/04/2019   CL 93 (L) 06/04/2019   CO2 19 (L) 06/04/2019   Lab  Results  Component Value Date   CHOL 202 (H) 02/06/2016   HDL 33 (L) 02/06/2016   LDLCALC 144 (H) 02/06/2016   TRIG 125 02/06/2016   CHOLHDL 6.1 (H) 02/06/2016   Lab Results  Component Value Date   TSH 2.699 01/02/2019   Lab Results  Component Value Date   HGBA1C 8.1 10/05/2019   Lab Results  Component Value Date   WBC 8.1 06/04/2019   HGB 14.9 06/04/2019   HCT 42.6 06/04/2019   MCV 87.7 06/04/2019   PLT 237 06/04/2019   Lab Results  Component Value Date   ALT 53 (H) 01/01/2019   AST 29 01/01/2019   ALKPHOS 41 01/01/2019   BILITOT 0.8 01/01/2019     Review of Systems  Constitutional: Negative for chills and fever.  HENT: Negative for drooling, ear discharge, ear pain and sore throat.   Respiratory: Negative for cough, shortness of breath and wheezing.   Cardiovascular: Negative for chest pain, palpitations and leg swelling.  Gastrointestinal: Negative for abdominal pain, blood in stool, bowel incontinence, constipation, diarrhea, nausea and vomiting.  Endocrine: Negative for polydipsia.  Genitourinary: Positive for flank pain, frequency, hesitancy, incomplete emptying and nocturia. Negative for bladder incontinence, dysuria, hematuria and urgency.  Musculoskeletal: Positive for back pain. Negative for myalgias and neck pain.  Skin: Negative for rash.  Allergic/Immunologic: Negative for environmental allergies.  Neurological: Negative for dizziness, tingling, numbness, headaches and paresthesias.  Hematological: Does not bruise/bleed easily.  Psychiatric/Behavioral: Negative for suicidal ideas. The patient is not nervous/anxious.     Patient Active Problem List   Diagnosis Date Noted  . DDD (degenerative disc disease), cervical  08/08/2017  . Type 2 diabetes mellitus without complication, without long-term current use of insulin ([place]) 07/27/2017  . Obesity (BMI 30.0-34.9) 03/11/2017  . Hyperlipidemia due to type 2 diabetes mellitus ([place]) 01/28/2017    No Known  Allergies  Past Surgical History:  Procedure Laterality Date  . BREAST SURGERY Left    lumpectomy  . CHOLECYSTECTOMY    . KNEE ARTHROSCOPY Left     Social History   Tobacco Use  . Smoking status: Never Smoker  . Smokeless tobacco: Never Used  Substance Use Topics  . Alcohol use: Yes  . Drug use: No     Medication list has been reviewed and updated.  Current Meds  Medication Sig  . Dulaglutide 1.5 MG/0.5ML SOPN Inject 3 mg into the skin every Sunday.   . gabapentin (NEURONTIN) 100 MG capsule neuro  . hydrOXYzine (ATARAX/VISTARIL) 10 MG tablet Take 1 tablet (10 mg total) by mouth at bedtime.  . levETIRAcetam (KEPPRA) 500 MG tablet Take 1 tablet (500 mg total) by mouth 2 (two) times daily.  . metFORMIN (GLUCOPHAGE) 1000 MG tablet Take 1,000 mg by mouth 2 (two) times daily with a meal.   . mupirocin ointment (BACTROBAN) 2 % Apply 1 application topically 2 (two) times daily.  Marland Kitchen olmesartan (BENICAR) 20 MG tablet Take 1 tablet (20 mg total) by mouth daily.  [name] ULTRA test strip   . rosuvastatin (CRESTOR) 5 MG tablet Take 1 tablet (5 mg total) by mouth daily. (Patient taking differently: Take 10 mg by mouth daily.)    PHQ 2/9 Scores 04/28/2020 04/15/2020 03/04/2020 11/27/2019  PHQ - 2 Score 0 2 0 0  PHQ- 9 Score 0 4 0 0    GAD 7 : Generalized Anxiety Score 04/28/2020 04/15/2020 03/04/2020 11/27/2019  Nervous, Anxious, on Edge 0 0 0 0  Control/stop worrying 0 1 0 0  Worry too much - different things 0 1 0 0  Trouble relaxing 0 0 0 0  Restless 0 0 0 0  Easily annoyed or irritable 0 1 0 0  Afraid - awful might happen 0 1 0 0  Total GAD 7 Score 0 4 0 0  Anxiety Difficulty - Not difficult at all - -    BP Readings from Last 3 Encounters:  04/28/20 138/80  04/15/20 120/80  03/04/20 138/80    Physical Exam Vitals and nursing note reviewed.  HENT:     Head: Normocephalic.     Right Ear: Tympanic membrane and external ear normal.     Left Ear: Tympanic membrane and  external ear normal.     Nose: Nose normal.     Mouth/Throat:     Mouth: Oropharynx is clear and moist.  Eyes:     General: No scleral icterus.       Right eye: No discharge.        Left eye: No discharge.     Extraocular Movements: EOM normal.     Conjunctiva/sclera: Conjunctivae normal.     Pupils: Pupils are equal, round, and reactive to light.  Neck:     Thyroid: No thyromegaly.     Vascular: No JVD.     Trachea: No tracheal deviation.  Cardiovascular:     Rate and Rhythm: Normal rate and regular rhythm.     Pulses: Intact distal pulses.     Heart sounds: Normal heart sounds. No murmur heard. No friction rub. No gallop.   Pulmonary:     Effort: No respiratory distress.  Breath sounds: Normal breath sounds. No wheezing, rhonchi or rales.  Abdominal:     General: Bowel sounds are normal.     Palpations: Abdomen is soft. There is no hepatosplenomegaly or mass.     Tenderness: There is no abdominal tenderness. There is no CVA tenderness, guarding or rebound.  Genitourinary:    Prostate: Enlarged and tender.     Comments: Firm consistency Musculoskeletal:        General: No tenderness or edema. Normal range of motion.     Cervical back: Normal range of motion and neck supple.  Lymphadenopathy:     Cervical: No cervical adenopathy.  Skin:    General: Skin is warm.     Findings: No rash.  Neurological:     Mental Status: He is alert and oriented to person, place, and time.     Cranial Nerves: No cranial nerve deficit.     Deep Tendon Reflexes: Strength normal and reflexes are normal and symmetric.     Wt Readings from Last 3 Encounters:  04/28/20 222 lb (100.7 kg)  04/15/20 222 lb (100.7 kg)  03/04/20 226 lb (102.5 kg)    BP 138/80   Pulse 68   Ht 5\' 10"  (1.778 m)   Wt 222 lb (100.7 kg)   BMI 31.85 kg/m   Assessment and Plan: 1. Nocturia New onset. Particularly has been worse over the last 2 weeks but is gradually improving according the patient patient has  a history of diabetes which has not been fully under control which is followed by endocrinology. Patient began having nocturia 4-5 times that it does not sound like he was having complete emptying of the bladder suggesting that there is some obstruction at the prostatic junction. Fasting blood sugar was 186 today and may be contributing to the problem with polyuria but I am more concerned about inability to fully empty the bladder due to partial obstruction at the prostatic junction. We will refer to urology for evaluation for urinary retention and incomplete emptying of the bladder. - POCT Glucose (CBG) - Ambulatory referral to Urology  2. Benign prostatic hyperplasia with urinary frequency As noted above. - Ambulatory referral to Urology  3. Lumbar back pain Patient also has lumbar pain that is radiating to the left groin area. Patient has a history of kidney stones but does not feel that the pain is at that level. Pain is primarily exacerbated by any movement so I am concerned that there is mostly issues with the lower back and we may need to do an x-ray but this will be further evaluated after urology evaluation today.  4. Type 2 diabetes mellitus without complication, without long-term current use of insulin (HCC) Chronic. Uncontrolled. Patient has been followed by endocrinology at [facility] and is currently on Trulicity and Glucophage. I rather doubt that this is under full control and will need to be further evaluated in the future.

## 2020-04-28 NOTE — Progress Notes (Signed)
04/28/2020 11:22 AM   [place] 10-18-1970 [id number]  Referring provider: Juline Patch, MD [address] [place],  [place] [address]  No chief complaint on file.   HPI: I was consulted to assess the patient who is working to clinic for a 2-week history of of varying flow.  Sometimes flow is slow other times good depending on fluid intake.  He is stopping and starting.  He has had some left-sided flank pain that radiates to the left lower quadrant with no nausea or vomiting.  A few months ago his flow was better and he was not having pain  He normally voids every 2-3 hours gets up once or twice a night.  He is on oral hypoglycemics.  No constipation.  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 Allergy   . Prostatitis     Surgical History: Past Surgical History:  Procedure Laterality Date  . BREAST SURGERY Left    lumpectomy  . CHOLECYSTECTOMY    . KNEE ARTHROSCOPY Left     Home Medications:  Allergies as of 04/28/2020   No Known Allergies     Medication List       Accurate as of April 28, 2020 11:22 AM. If you have any questions, ask your nurse or doctor.        STOP taking these medications   sertraline 25 MG tablet Commonly known as: Zoloft Stopped by: Otilio Miu, MD     TAKE these medications   Dulaglutide 1.5 MG/0.5ML Sopn Inject 3 mg into the skin every Sunday.   gabapentin 100 MG capsule Commonly known as: NEURONTIN neuro   hydrOXYzine 10 MG tablet Commonly known as: ATARAX/VISTARIL Take 1 tablet (10 mg total) by mouth at bedtime.   levETIRAcetam 500 MG tablet Commonly known as: KEPPRA Take 1 tablet (500 mg total) by mouth 2 (two) times daily.    metFORMIN 1000 MG tablet Commonly known as: GLUCOPHAGE Take 1,000 mg by mouth 2 (two) times daily with a meal.   mupirocin ointment 2 % Commonly known as: Bactroban Apply 1 application topically 2 (two) times daily.   olmesartan 20 MG tablet Commonly known as: BENICAR Take 1 tablet (20 mg total) by mouth daily.   OneTouch Ultra test strip Generic drug: glucose blood   rosuvastatin 5 MG tablet Commonly known as: CRESTOR Take 1 tablet (5 mg total) by mouth daily. What changed: how much to take       Allergies: No Known Allergies  Family History: Family History  Problem Relation Age of Onset  . Diabetes Father   . Cancer Maternal Uncle     Social History:  reports that he has never smoked. He has never used smokeless tobacco. He reports current alcohol use. He reports that he does not use drugs.  ROS:                                        Physical Exam: There were no vitals taken for this visit.  Constitutional:  Alert and oriented, No acute distress. HEENT: [place] AT, moist mucus membranes.  Trachea midline, no masses. Cardiovascular: No clubbing, cyanosis, or edema. Respiratory: Normal respiratory effort, no increased work of breathing. GI: Abdomen is soft, nontender, nondistended, no abdominal masses GU: No CVA tenderness.  No distress.  Male genitalia normal.  Small benign prostate Skin: No rashes, bruises or suspicious lesions. Lymph: No cervical or inguinal adenopathy. Neurologic: Grossly intact, no focal deficits, moving all 4 extremities. Psychiatric: Normal mood and affect.  Laboratory Data: Lab Results  Component Value Date   WBC 8.1 06/04/2019   HGB 14.9 06/04/2019   HCT 42.6 06/04/2019   MCV 87.7 06/04/2019   PLT 237 06/04/2019    Lab Results  Component Value Date   CREATININE 0.82 06/04/2019    No results found for: PSA  No results found for: TESTOSTERONE  Lab Results  Component Value Date   HGBA1C 8.1 10/05/2019     Urinalysis    Component Value Date/Time   COLORURINE YELLOW (A) 01/01/2019 2242   APPEARANCEUR CLEAR (A) 01/01/2019 2242   APPEARANCEUR HAZY 08/20/2013 0856   LABSPEC 1.015 01/01/2019 2242   LABSPEC 1.025 08/20/2013 0856   PHURINE 7.0 01/01/2019 2242   GLUCOSEU 150 (A) 01/01/2019 2242   GLUCOSEU NEGATIVE 08/20/2013 0856   HGBUR NEGATIVE 01/01/2019 2242   BILIRUBINUR NEGATIVE 01/01/2019 2242   BILIRUBINUR negative 12/07/2018 1437   BILIRUBINUR 1+ 08/20/2013 [place] 01/01/2019 2242   PROTEINUR NEGATIVE 01/01/2019 2242   UROBILINOGEN 0.2 12/07/2018 1437   NITRITE NEGATIVE 01/01/2019 2242   LEUKOCYTESUR NEGATIVE 01/01/2019 2242   LEUKOCYTESUR NEGATIVE 08/20/2013 0856    Pertinent Imaging: Urine reviewed.  Urine sent for culture.  Chart reviewed.  No blood in urine but have calcium oxalate crystals  Assessment & Plan: Patient has a new onset of flow symptoms.  He was scanned today for  164 mL.  No pain or pain with ejaculation.  We will get a CT scan today.  I put him on Flomax.  See nurse practitioner tomorrow with repeat residual.  This will give good follow-up for possible or probable stone and we will proceed accordingly   1. Acute prostatitis  - Urinalysis, Complete - BLADDER SCAN AMB NON-IMAGING   No follow-ups on file.  Reece Packer, MD  [place] [address] [place], [place] [address] [phone]

## 2020-04-29 ENCOUNTER — Ambulatory Visit (INDEPENDENT_AMBULATORY_CARE_PROVIDER_SITE_OTHER): Payer: BC Managed Care – PPO | Admitting: Physician Assistant

## 2020-04-29 ENCOUNTER — Encounter: Payer: Self-pay | Admitting: Family Medicine

## 2020-04-29 ENCOUNTER — Encounter: Payer: Self-pay | Admitting: Physician Assistant

## 2020-04-29 VITALS — BP 147/93 | HR 102 | Ht 69.0 in | Wt 222.0 lb

## 2020-04-29 DIAGNOSIS — R39198 Other difficulties with micturition: Secondary | ICD-10-CM | POA: Diagnosis not present

## 2020-04-29 LAB — BLADDER SCAN AMB NON-IMAGING

## 2020-04-29 NOTE — Progress Notes (Signed)
04/29/2020 10:25 AM   Marcus Baldwin [birth year] [id number]  CC: Chief Complaint  Patient presents with  . Follow-up  . Prostatitis   HPI: Marcus Baldwin is a 50 y.o. male with a recent history of intermittent slow urinary flow and LLQ discomfort who presents today for CT results and repeat PVR.  He was seen in clinic yesterday by Dr. Matilde Sprang for evaluation of the above.  UA at that time notable for calcium oxalate crystals, otherwise WNL.  He has since undergone CT stone study which revealed no urolithiasis or hydronephrosis.  Today he reports starting Flomax yesterday per Dr. Matilde Sprang.  He has not noticed any change in his urinary stream yet.  Notably, he recalls that he was recently started on hydroxyzine and that his recent urinary symptoms and low back pain started within several days of initiating this medication.  He intends to discontinue it as he is concerned his urinary symptoms may represent a side effect of this medication.  PVR 96 mL.  Notably, patient has a history of back problems.  PMH: Past Medical History:  Diagnosis Date  . Allergy   . Prostatitis     Surgical History: Past Surgical History:  Procedure Laterality Date  . BREAST SURGERY Left    lumpectomy  . CHOLECYSTECTOMY    . KNEE ARTHROSCOPY Left     Home Medications:  Allergies as of 04/29/2020   No Known Allergies     Medication List       Accurate as of April 29, 2020 10:25 AM. If you have any questions, ask your nurse or doctor.        Dulaglutide 1.5 MG/0.5ML Sopn Inject 3 mg into the skin every Sunday.   gabapentin 100 MG capsule Commonly known as: NEURONTIN neuro   hydrOXYzine 10 MG tablet Commonly known as: ATARAX/VISTARIL Take 1 tablet (10 mg total) by mouth at bedtime.   levETIRAcetam 500 MG tablet Commonly known as: KEPPRA Take 1 tablet (500 mg total) by mouth 2 (two) times daily.   metFORMIN 1000 MG tablet Commonly known as: GLUCOPHAGE Take  1,000 mg by mouth 2 (two) times daily with a meal.   mupirocin ointment 2 % Commonly known as: Bactroban Apply 1 application topically 2 (two) times daily.   olmesartan 20 MG tablet Commonly known as: BENICAR Take 1 tablet (20 mg total) by mouth daily.   OneTouch Ultra test strip Generic drug: glucose blood   rosuvastatin 5 MG tablet Commonly known as: CRESTOR Take 1 tablet (5 mg total) by mouth daily. What changed: how much to take   tamsulosin 0.4 MG Caps capsule Commonly known as: FLOMAX Take 1 capsule (0.4 mg total) by mouth daily.       Allergies:  No Known Allergies  Family History: Family History  Problem Relation Age of Onset  . Diabetes Father   . Cancer Maternal Uncle     Social History:   reports that he has never smoked. He has never used smokeless tobacco. He reports current alcohol use. He reports that he does not use drugs.  Physical Exam: BP (!) 147/93   Pulse (!) 102   Ht 5\' 9"  (1.753 m)   Wt 222 lb (100.7 kg)   BMI 32.78 kg/m   Constitutional:  Alert and oriented, no acute distress, nontoxic appearing HEENT: [place], AT Cardiovascular: No clubbing, cyanosis, or edema Respiratory: Normal respiratory effort, no increased work of breathing Skin: No rashes, bruises or suspicious lesions Neurologic: Grossly intact, no focal  deficits, moving all 4 extremities Psychiatric: Normal mood and affect  Laboratory Data: Results for orders placed or performed in visit on 04/29/20  Bladder Scan (Post Void Residual) in office  Result Value Ref Range   Scan Result 61mL    Pertinent Imaging: CT RENAL STONE STUDY  Narrative CLINICAL DATA:  Left flank pain for 2 weeks.  Nephrolithiasis.  EXAM: CT ABDOMEN AND PELVIS WITHOUT CONTRAST  TECHNIQUE: Multidetector CT imaging of the abdomen and pelvis was performed following the standard protocol without IV contrast.  COMPARISON:  11/19/2009  FINDINGS: Lower chest: No acute findings.  Hepatobiliary: No  mass visualized on this unenhanced exam. Mild-to-moderate diffuse hepatic steatosis noted. Prior cholecystectomy. No evidence of biliary obstruction.  Pancreas: No mass or inflammatory process visualized on this unenhanced exam.  Spleen:  Within normal limits in size.  Adrenals/Urinary tract: No evidence of urolithiasis or hydronephrosis. Unremarkable unopacified urinary bladder.  Stomach/Bowel: No evidence of obstruction, inflammatory process, or abnormal fluid collections. Normal appendix visualized.  Vascular/Lymphatic: No pathologically enlarged lymph nodes identified. No evidence of abdominal aortic aneurysm.  Reproductive:  No mass or other significant abnormality.  Other:  None.  Musculoskeletal:  No suspicious bone lesions identified.  IMPRESSION: No evidence of urolithiasis, hydronephrosis, or other acute findings.  Hepatic steatosis.   Electronically Signed By: Marlaine Hind M.D. On: 04/28/2020 13:30  I personally reviewed the images referenced above and note no urolithiasis or hydronephrosis.  Assessment & Plan:   1. Slow urinary stream PVR improved today on Flomax.  Differential at this time includes inflammatory prostatitis versus hydroxyzine side effect.  Counseled patient to continue Flomax and stop hydroxyzine as desired.  We will follow up with a virtual visit in 2 weeks to recheck his symptoms.  If he is continuing to have discomfort or slow stream at that point, will treat with Mobic for likely inflammatory prostatitis.  Patient is in agreement with this plan. - Bladder Scan (Post Void Residual) in office   Return in about 2 weeks (around 05/13/2020) for Virtual visit for symptom recheck.  Debroah Loop, PA-C  [facility] [address] [place], [place] [address] [phone]

## 2020-05-01 LAB — CULTURE, URINE COMPREHENSIVE

## 2020-05-07 ENCOUNTER — Other Ambulatory Visit: Payer: Self-pay | Admitting: Family Medicine

## 2020-05-07 DIAGNOSIS — F5102 Adjustment insomnia: Secondary | ICD-10-CM

## 2020-05-07 NOTE — Telephone Encounter (Signed)
   Notes to clinic:  requesting a 90 day supply   Requested Prescriptions  Pending Prescriptions Disp Refills   hydrOXYzine (ATARAX/VISTARIL) 10 MG tablet [Pharmacy Med Name: HYDROXYZINE HCL 10 MG TABLET] 90 tablet 1    Sig: TAKE 1 TABLET BY MOUTH EVERYDAY AT BEDTIME      Ear, Nose, and Throat:  Antihistamines Passed - 05/07/2020  2:17 PM      Passed - Valid encounter within last 12 months    Recent Outpatient Visits           1 week ago Nocturia   [facility] Juline Patch, MD   3 weeks ago Adjustment reaction with anxiety and depression   [facility] Juline Patch, MD   2 months ago Primary osteoarthritis of right knee   [facility] Juline Patch, MD   5 months ago Strain of lumbar region, initial encounter   [facility] Juline Patch, MD   7 months ago Cellulitis of left upper extremity   [facility] Juline Patch, MD       Future Appointments             In 6 days Debroah Loop, PA-C [place]   In 3 weeks Juline Patch, MD [facility], [facility]

## 2020-05-13 ENCOUNTER — Telehealth (INDEPENDENT_AMBULATORY_CARE_PROVIDER_SITE_OTHER): Payer: BC Managed Care – PPO | Admitting: Physician Assistant

## 2020-05-13 ENCOUNTER — Other Ambulatory Visit: Payer: Self-pay

## 2020-05-13 ENCOUNTER — Encounter: Payer: Self-pay | Admitting: Family Medicine

## 2020-05-13 DIAGNOSIS — R39198 Other difficulties with micturition: Secondary | ICD-10-CM

## 2020-05-13 NOTE — Progress Notes (Signed)
Virtual Visit via Telephone Note  I connected with Marcus Baldwin on 05/13/20 at  3:00 PM EST by telephone and verified that I am speaking with the correct person using two identifiers.  Location: Patient: Home in [place], [place] Provider: Clinic in [place], [place]   I discussed the limitations, risks, security and privacy concerns of performing an evaluation and management service by telephone and the availability of in person appointments. I also discussed with the patient that there may be a patient responsible charge related to this service. The patient expressed understanding and agreed to proceed.  History of Present Illness: Marcus Baldwin is a 51 y.o. male with a recent history of intermittent slow urinary flow and LLQ discomfort consistent with inflammatory prostatitis versus hydroxyzine side effect who presents today for symptom recheck on Flomax after having stopped hydroxyzine.  He previously underwent CT stone study which revealed no urolithiasis or hydronephrosis  Today he reports resolution of urinary intermittency with some persistent, improving hesitancy.  He continues to have some slight LLQ discomfort, improved over prior.  He denies dysuria.  He stopped hydroxyzine as planned and reports minimal bother from his symptoms.  He denies a history of renal disease or GI bleeds.  He has taken Mobic in the past for back pain but does not believe it has helped him.  He has leftover Mobic 15 mg at home.   Observations/Objective: Asking appropriate questions, no apparent distress  Assessment and Plan: Symptoms improving after stopping hydroxyzine, however he does continue to have some mild LLQ discomfort and urinary hesitancy consistent with possible inflammatory prostatitis.  I counseled him to continue Flomax and recommended starting Mobic 15 mg daily x1 month for management of inflammation.  He reiterated that he has this medication on hand he does not require  prescription today and he is in agreement with this plan.  Follow Up Instructions: Patient to follow-up via MyChart in 1 month if symptoms do not improve.   I discussed the assessment and treatment plan with the patient. The patient was provided an opportunity to ask questions and all were answered. The patient agreed with the plan and demonstrated an understanding of the instructions.   The patient was advised to call back or seek an in-person evaluation if the symptoms worsen or if the condition fails to improve as anticipated.  I provided 10 minutes of non-face-to-face time during this encounter.   Debroah Loop, PA-C

## 2020-06-02 ENCOUNTER — Other Ambulatory Visit: Payer: Self-pay

## 2020-06-02 ENCOUNTER — Ambulatory Visit: Payer: BC Managed Care – PPO

## 2020-06-02 ENCOUNTER — Ambulatory Visit: Payer: BC Managed Care – PPO | Admitting: Family Medicine

## 2020-06-02 ENCOUNTER — Ambulatory Visit (INDEPENDENT_AMBULATORY_CARE_PROVIDER_SITE_OTHER): Payer: BC Managed Care – PPO

## 2020-06-02 DIAGNOSIS — Z23 Encounter for immunization: Secondary | ICD-10-CM

## 2020-06-02 NOTE — Progress Notes (Signed)
Patient came in today to receive 2nd Shingrix injection. Patient was brought to ROOM 2. Patient was given injection while sitting on the bed. Patient tolerated injection well and had no complaints. Patient informed of possible side effects and given VIS to take home.

## 2020-07-14 LAB — HEPATIC FUNCTION PANEL
ALT: 51 — AB (ref 10–40)
AST: 27 (ref 14–40)

## 2020-07-14 LAB — HM DIABETES FOOT EXAM: HM Diabetic Foot Exam: NORMAL

## 2020-07-14 LAB — LIPID PANEL
Cholesterol: 116 (ref 0–200)
HDL: 30 — AB (ref 35–70)
LDL Cholesterol: 64
Triglycerides: 108 (ref 40–160)

## 2020-07-14 LAB — HEMOGLOBIN A1C: Hemoglobin A1C: 8.3

## 2020-07-14 LAB — BASIC METABOLIC PANEL: Glucose: 185

## 2020-07-18 ENCOUNTER — Encounter: Payer: Self-pay | Admitting: Family Medicine

## 2020-08-26 ENCOUNTER — Ambulatory Visit: Payer: BC Managed Care – PPO | Admitting: Family Medicine

## 2020-08-26 ENCOUNTER — Encounter: Payer: Self-pay | Admitting: Family Medicine

## 2020-08-26 ENCOUNTER — Other Ambulatory Visit: Payer: Self-pay

## 2020-08-26 VITALS — BP 140/100 | HR 88 | Ht 69.0 in | Wt 220.0 lb

## 2020-08-26 DIAGNOSIS — R059 Cough, unspecified: Secondary | ICD-10-CM

## 2020-08-26 DIAGNOSIS — I10 Essential (primary) hypertension: Secondary | ICD-10-CM | POA: Diagnosis not present

## 2020-08-26 DIAGNOSIS — J0100 Acute maxillary sinusitis, unspecified: Secondary | ICD-10-CM | POA: Diagnosis not present

## 2020-08-26 MED ORDER — OLMESARTAN MEDOXOMIL-HCTZ 20-12.5 MG PO TABS: 1.0000 | ORAL_TABLET | Freq: Every day | ORAL | 1 refills | Status: AC

## 2020-08-26 MED ORDER — AMOXICILLIN-POT CLAVULANATE 875-125 MG PO TABS: 1.0000 | ORAL_TABLET | Freq: Two times a day (BID) | ORAL | 0 refills | Status: DC

## 2020-08-26 MED ORDER — GUAIFENESIN-CODEINE 100-10 MG/5ML PO SYRP: 5.0000 mL | ORAL_SOLUTION | Freq: Four times a day (QID) | ORAL | 0 refills | Status: AC | PRN

## 2020-08-26 NOTE — Progress Notes (Signed)
Date:  08/26/2020   Name:  Marcus Baldwin [facility]   DOB:  Aug 13, 1970   MRN:  [id number]   Chief Complaint: Sinusitis (Congestion, cough at night)  Sinusitis This is a new problem. The current episode started 1 to 4 weeks ago (12 days ago). The problem has been gradually improving since onset. There has been no fever. The pain is moderate. Pertinent negatives include no chills, congestion, coughing, diaphoresis, ear pain, headaches, hoarse voice, neck pain, shortness of breath, sinus pressure, sneezing, sore throat or swollen glands. Past treatments include nothing. The treatment provided moderate relief.  Hypertension This is a chronic problem. The current episode started more than 1 year ago. The problem has been gradually improving since onset. The problem is controlled. Pertinent negatives include no anxiety, blurred vision, chest pain, headaches, malaise/fatigue, neck pain, orthopnea, palpitations, peripheral edema, PND, shortness of breath or sweats. There are no associated agents to hypertension. Past treatments include angiotensin blockers and diuretics. The current treatment provides moderate improvement. There is no history of angina, kidney disease, CAD/MI, CVA, heart failure, left ventricular hypertrophy, PVD or retinopathy. There is no history of chronic renal disease, a hypertension causing med or renovascular disease.    Lab Results  Component Value Date   CREATININE 0.82 06/04/2019   BUN 13 06/04/2019   NA 125 (L) 06/04/2019   K 3.5 06/04/2019   CL 93 (L) 06/04/2019   CO2 19 (L) 06/04/2019   Lab Results  Component Value Date   CHOL 116 07/14/2020   HDL 30 (A) 07/14/2020   LDLCALC 64 07/14/2020   TRIG 108 07/14/2020   CHOLHDL 6.1 (H) 02/06/2016   Lab Results  Component Value Date   TSH 2.699 01/02/2019   Lab Results  Component Value Date   HGBA1C 8.3 07/14/2020   Lab Results  Component Value Date   WBC 8.1 06/04/2019   HGB 14.9 06/04/2019   HCT 42.6  06/04/2019   MCV 87.7 06/04/2019   PLT 237 06/04/2019   Lab Results  Component Value Date   ALT 51 (A) 07/14/2020   AST 27 07/14/2020   ALKPHOS 41 01/01/2019   BILITOT 0.8 01/01/2019     Review of Systems  Constitutional: Negative for chills, diaphoresis, fever and malaise/fatigue.  HENT: Negative for congestion, drooling, ear discharge, ear pain, hoarse voice, sinus pressure, sneezing and sore throat.   Eyes: Negative for blurred vision.  Respiratory: Negative for cough, shortness of breath and wheezing.   Cardiovascular: Negative for chest pain, palpitations, orthopnea, leg swelling and PND.  Gastrointestinal: Negative for abdominal pain, blood in stool, constipation, diarrhea and nausea.  Endocrine: Negative for polydipsia.  Genitourinary: Negative for dysuria, frequency, hematuria and urgency.  Musculoskeletal: Negative for back pain, myalgias and neck pain.  Skin: Negative for rash.  Allergic/Immunologic: Negative for environmental allergies.  Neurological: Negative for dizziness and headaches.  Hematological: Does not bruise/bleed easily.  Psychiatric/Behavioral: Negative for suicidal ideas. The patient is not nervous/anxious.     Patient Active Problem List   Diagnosis Date Noted  . DDD (degenerative disc disease), cervical 08/08/2017  . Type 2 diabetes mellitus without complication, without long-term current use of insulin ([place]) 07/27/2017  . Obesity (BMI 30.0-34.9) 03/11/2017  . Hyperlipidemia due to type 2 diabetes mellitus ([place]) 01/28/2017    No Known Allergies  Past Surgical History:  Procedure Laterality Date  . BREAST SURGERY Left    lumpectomy  . CHOLECYSTECTOMY    . KNEE ARTHROSCOPY Left     Social  History   Tobacco Use  . Smoking status: Never Smoker  . Smokeless tobacco: Never Used  Substance Use Topics  . Alcohol use: Yes  . Drug use: No     Medication list has been reviewed and updated.  Current Meds  Medication Sig  . Dulaglutide 1.5  MG/0.5ML SOPN Inject 4.5 mg into the skin every Sunday.  . gabapentin (NEURONTIN) 100 MG capsule neuro  . levETIRAcetam (KEPPRA) 500 MG tablet Take 1 tablet (500 mg total) by mouth 2 (two) times daily.  . metFORMIN (GLUCOPHAGE) 1000 MG tablet Take 1,000 mg by mouth 2 (two) times daily with a meal.   . olmesartan (BENICAR) 20 MG tablet Take 1 tablet (20 mg total) by mouth daily.  [name] ULTRA test strip   . rosuvastatin (CRESTOR) 5 MG tablet Take 1 tablet (5 mg total) by mouth daily. (Patient taking differently: Take 10 mg by mouth daily.)  . tamsulosin (FLOMAX) 0.4 MG CAPS capsule Take 1 capsule (0.4 mg total) by mouth daily.    PHQ 2/9 Scores 08/26/2020 04/28/2020 04/15/2020 03/04/2020  PHQ - 2 Score 0 0 2 0  PHQ- 9 Score 0 0 4 0    GAD 7 : Generalized Anxiety Score 08/26/2020 04/28/2020 04/15/2020 03/04/2020  Nervous, Anxious, on Edge 0 0 0 0  Control/stop worrying 0 0 1 0  Worry too much - different things 0 0 1 0  Trouble relaxing 0 0 0 0  Restless 0 0 0 0  Easily annoyed or irritable 0 0 1 0  Afraid - awful might happen 0 0 1 0  Total GAD 7 Score 0 0 4 0  Anxiety Difficulty - - Not difficult at all -    BP Readings from Last 3 Encounters:  08/26/20 (!) 140/100  04/29/20 (!) 147/93  04/28/20 (!) 179/88    Physical Exam Vitals and nursing note reviewed.  HENT:     Head: Normocephalic.     Right Ear: Tympanic membrane, ear canal and external ear normal.     Left Ear: Tympanic membrane, ear canal and external ear normal.     Nose: Nose normal. No congestion or rhinorrhea.     Mouth/Throat:     Mouth: Mucous membranes are moist.  Eyes:     General: No scleral icterus.       Right eye: No discharge.        Left eye: No discharge.     Conjunctiva/sclera: Conjunctivae normal.     Pupils: Pupils are equal, round, and reactive to light.  Neck:     Thyroid: No thyromegaly.     Vascular: No JVD.     Trachea: No tracheal deviation.  Cardiovascular:     Rate and Rhythm: Normal  rate and regular rhythm.     Heart sounds: Normal heart sounds. No murmur heard. No friction rub. No gallop.   Pulmonary:     Effort: No respiratory distress.     Breath sounds: Normal breath sounds. No wheezing, rhonchi or rales.  Abdominal:     General: Bowel sounds are normal.     Palpations: Abdomen is soft. There is no mass.     Tenderness: There is no abdominal tenderness. There is no guarding or rebound.  Musculoskeletal:        General: No tenderness. Normal range of motion.     Cervical back: Normal range of motion and neck supple.  Lymphadenopathy:     Cervical: No cervical adenopathy.  Skin:  General: Skin is warm.     Findings: No rash.  Neurological:     Mental Status: He is alert and oriented to person, place, and time.     Cranial Nerves: No cranial nerve deficit.     Deep Tendon Reflexes: Reflexes are normal and symmetric.     Wt Readings from Last 3 Encounters:  08/26/20 220 lb (99.8 kg)  04/29/20 222 lb (100.7 kg)  04/28/20 221 lb (100.2 kg)    BP (!) 140/100   Pulse 88   Ht 5\' 9"  (1.753 m)   Wt 220 lb (99.8 kg)   BMI 32.49 kg/m   Assessment and Plan:

## 2020-10-02 ENCOUNTER — Encounter: Payer: Self-pay | Admitting: Family Medicine

## 2020-10-08 ENCOUNTER — Ambulatory Visit (INDEPENDENT_AMBULATORY_CARE_PROVIDER_SITE_OTHER): Payer: BC Managed Care – PPO | Admitting: Physician Assistant

## 2020-10-08 ENCOUNTER — Encounter: Payer: Self-pay | Admitting: Physician Assistant

## 2020-10-08 ENCOUNTER — Other Ambulatory Visit: Payer: Self-pay

## 2020-10-08 VITALS — BP 145/88 | HR 116 | Ht 69.0 in | Wt 217.0 lb

## 2020-10-08 DIAGNOSIS — R351 Nocturia: Secondary | ICD-10-CM

## 2020-10-08 LAB — URINALYSIS, COMPLETE
Bilirubin, UA: NEGATIVE
Glucose, UA: NEGATIVE
Leukocytes,UA: NEGATIVE
Nitrite, UA: NEGATIVE
Protein,UA: NEGATIVE
RBC, UA: NEGATIVE
Specific Gravity, UA: 1.025 (ref 1.005–1.030)
Urobilinogen, Ur: 0.2 mg/dL (ref 0.2–1.0)
pH, UA: 5 (ref 5.0–7.5)

## 2020-10-08 LAB — MICROSCOPIC EXAMINATION
Bacteria, UA: NONE SEEN
Epithelial Cells (non renal): NONE SEEN /hpf (ref 0–10)
RBC, Urine: NONE SEEN /hpf (ref 0–2)
WBC, UA: NONE SEEN /hpf (ref 0–5)

## 2020-10-08 LAB — BLADDER SCAN AMB NON-IMAGING

## 2020-10-08 MED ORDER — OXYBUTYNIN CHLORIDE ER 10 MG PO TB24: 10.0000 mg | ORAL_TABLET | Freq: Every day | ORAL | 0 refills | Status: DC

## 2020-10-08 MED ORDER — MIRABEGRON ER 25 MG PO TB24: 25.0000 mg | ORAL_TABLET | Freq: Every day | ORAL | 0 refills | Status: DC

## 2020-10-08 NOTE — Progress Notes (Signed)
10/08/2020 9:26 AM   Marcus Baldwin [birth year] [id number]  CC: Chief Complaint  Patient presents with   Urinary Frequency   HPI: Marcus Baldwin is a 50 y.o. male with PMH diabetes, HTN, nephrolithiasis, and acute prostatitis who presents today for evaluation of nocturia.   Today he reports a change in his nocturia associated with recent alterations of his hypertension medications.  He was on olmesartan for hypertension and then switched to combination olmesartan with HCTZ, which resulted in increased nocturia to 5 times nightly.  Because of this, he was switched back to olmesartan alone.  He notes his nocturia initially went back to baseline, but is now up to 6-7 times nightly.  He denies daytime frequency and states he is a snorer but has never had a sleep study.  Dr. Matilde Sprang and I saw him earlier this year for management of an acute episode of varying urinary flow associated with left flank pain radiating to the LLQ.  He was treated with Mobic and Flomax for management of inflammatory prostatitis.  He underwent CT stone study on 04/28/2020 with no evidence of urolithiasis.  He states his pain initially resolved after treatment earlier this year, however it has returned but is very mild and occasional and not overall bothersome.  He had previously stopped Flomax, but he resumed it last week and has noticed some improvement in his urinary stream on the medication.  He is already restricting fluids past 6:30 PM.  He had his PSA drawn with his PCP yesterday, 0.70 per patient report.  In-office UA today positive for 2+ ketones; urine microscopy with amorphous crystals. PVR 144mL.  PMH: Past Medical History:  Diagnosis Date   Allergy    Prostatitis     Surgical History: Past Surgical History:  Procedure Laterality Date   BREAST SURGERY Left    lumpectomy   CHOLECYSTECTOMY     KNEE ARTHROSCOPY Left     Home Medications:  Allergies as of 10/08/2020   No Known  Allergies      Medication List        Accurate as of October 08, 2020  9:26 AM. If you have any questions, ask your nurse or doctor.          amoxicillin-clavulanate 875-125 MG tablet Commonly known as: AUGMENTIN Take 1 tablet by mouth 2 (two) times daily.   Dulaglutide 1.5 MG/0.5ML Sopn Inject 4.5 mg into the skin every Sunday.   gabapentin 100 MG capsule Commonly known as: NEURONTIN neuro   guaiFENesin-codeine 100-10 MG/5ML syrup Commonly known as: ROBITUSSIN AC Take 5 mLs by mouth 4 (four) times daily as needed for cough.   levETIRAcetam 500 MG tablet Commonly known as: KEPPRA Take 1 tablet (500 mg total) by mouth 2 (two) times daily.   metFORMIN 1000 MG tablet Commonly known as: GLUCOPHAGE Take 1,000 mg by mouth 2 (two) times daily with a meal.   mupirocin ointment 2 % Commonly known as: Bactroban Apply 1 application topically 2 (two) times daily.   olmesartan 20 MG tablet Commonly known as: BENICAR Take 20 mg by mouth daily.   olmesartan-hydrochlorothiazide 20-12.5 MG tablet Commonly known as: Benicar HCT Take 1 tablet by mouth daily.   OneTouch Ultra test strip Generic drug: glucose blood   rosuvastatin 5 MG tablet Commonly known as: CRESTOR Take 1 tablet (5 mg total) by mouth daily. What changed: how much to take   tamsulosin 0.4 MG Caps capsule Commonly known as: FLOMAX Take 1 capsule (0.4 mg total)  by mouth daily.        Allergies:  No Known Allergies  Family History: Family History  Problem Relation Age of Onset   Diabetes Father    Cancer Maternal Uncle     Social History:   reports that he has never smoked. He has never used smokeless tobacco. He reports current alcohol use. He reports that he does not use drugs.  Physical Exam: BP (!) 145/88   Pulse (!) 116   Ht 5\' 9"  (1.753 m)   Wt 217 lb (98.4 kg)   BMI 32.05 kg/m   Constitutional:  Alert and oriented, no acute distress, nontoxic appearing HEENT: [place], AT Cardiovascular:  No clubbing, cyanosis, or edema Respiratory: Normal respiratory effort, no increased work of breathing Skin: No rashes, bruises or suspicious lesions Neurologic: Grossly intact, no focal deficits, moving all 4 extremities Psychiatric: Normal mood and affect  Laboratory Data: Results for orders placed or performed in visit on 10/08/20  Microscopic Examination   Urine  Result Value Ref Range   WBC, UA None seen 0 - 5 /hpf   RBC None seen 0 - 2 /hpf   Epithelial Cells (non renal) None seen 0 - 10 /hpf   Crystals Present (A) N/A   Crystal Type Amorphous Sediment N/A   Bacteria, UA None seen None seen/Few  Urinalysis, Complete  Result Value Ref Range   Specific Gravity, UA 1.025 1.005 - 1.030   pH, UA 5.0 5.0 - 7.5   Color, UA Yellow Yellow   Appearance Ur Clear Clear   Leukocytes,UA Negative Negative   Protein,UA Negative Negative/Trace   Glucose, UA Negative Negative   Ketones, UA 2+ (A) Negative   RBC, UA Negative Negative   Bilirubin, UA Negative Negative   Urobilinogen, Ur 0.2 0.2 - 1.0 mg/dL   Nitrite, UA Negative Negative   Microscopic Examination See below:   Bladder Scan (Post Void Residual) in office  Result Value Ref Range   Scan Result 163mL    Assessment & Plan:   1. Nocturia Increased after starting a diuretic, persistent after discontinuation of the diuretic.  He also reports some LUTS including weak stream improved on Flomax.  He is emptying appropriately.  We will start a trial of oxybutynin XL 10 mg daily with plans for symptom recheck with PVR and IPSS in 4 weeks.  If his symptoms persist, recommend cystoscopy for further evaluation of BPH. - Urinalysis, Complete - Bladder Scan (Post Void Residual) in office - oxybutynin (DITROPAN-XL) 10 MG 24 hr tablet; Take 1 tablet (10 mg total) by mouth daily.  Dispense: 30 tablet; Refill: 0   Return in about 4 weeks (around 11/05/2020) for Symptom recheck with PVR and IPSS.  Debroah Loop, PA-C  [facility] [address], [place], [place] [address] [phone]

## 2020-10-09 ENCOUNTER — Ambulatory Visit: Payer: BC Managed Care – PPO | Admitting: Family Medicine

## 2020-10-30 ENCOUNTER — Other Ambulatory Visit: Payer: Self-pay | Admitting: Physician Assistant

## 2020-10-30 DIAGNOSIS — R351 Nocturia: Secondary | ICD-10-CM

## 2020-11-05 ENCOUNTER — Other Ambulatory Visit: Payer: Self-pay

## 2020-11-05 ENCOUNTER — Encounter: Payer: Self-pay | Admitting: Physician Assistant

## 2020-11-05 ENCOUNTER — Ambulatory Visit (INDEPENDENT_AMBULATORY_CARE_PROVIDER_SITE_OTHER): Payer: BC Managed Care – PPO | Admitting: Physician Assistant

## 2020-11-05 VITALS — BP 150/94 | HR 96 | Ht 69.0 in | Wt 210.0 lb

## 2020-11-05 DIAGNOSIS — R351 Nocturia: Secondary | ICD-10-CM | POA: Diagnosis not present

## 2020-11-05 LAB — BLADDER SCAN AMB NON-IMAGING: Scan Result: 136 mL

## 2020-11-05 MED ORDER — OXYBUTYNIN CHLORIDE ER 10 MG PO TB24: 10.0000 mg | ORAL_TABLET | Freq: Every day | ORAL | 11 refills | Status: AC

## 2020-11-05 NOTE — Progress Notes (Signed)
11/05/2020 9:21 AM   Liam Graham Corallo [birth year] [id number]  CC: Chief Complaint  Patient presents with   Follow-up   HPI: Marcus Baldwin is a 50 y.o. male with PMH diabetes, hypertension, nephrolithiasis, acute prostatitis, and nocturia who presents today for symptom recheck on oxybutynin XL 10 mg daily.   Today he reports significant improvement in his urinary symptoms on combination Flomax and oxybutynin.  He is now voiding 1-2 times overnight and reports also improved daytime frequency, now x3.  He has occasional urges to void, however these are not terribly bothersome.  He denies any pelvic or perineal discomfort today.  He denies constipation or dry eye on oxybutynin, but states he has had some intermittent, mild dry mouth that is relieved by drinking water.  PVR 136 mL.  PMH: Past Medical History:  Diagnosis Date   Allergy    Prostatitis     Surgical History: Past Surgical History:  Procedure Laterality Date   BREAST SURGERY Left    lumpectomy   CHOLECYSTECTOMY     KNEE ARTHROSCOPY Left     Home Medications:  Allergies as of 11/05/2020   No Known Allergies      Medication List        Accurate as of November 05, 2020  9:21 AM. If you have any questions, ask your nurse or doctor.          STOP taking these medications    amoxicillin-clavulanate 875-125 MG tablet Commonly known as: AUGMENTIN Stopped by: Debroah Loop, PA-C       TAKE these medications    Dulaglutide 1.5 MG/0.5ML Sopn Inject 4.5 mg into the skin every Sunday.   gabapentin 100 MG capsule Commonly known as: NEURONTIN neuro   guaiFENesin-codeine 100-10 MG/5ML syrup Commonly known as: ROBITUSSIN AC Take 5 mLs by mouth 4 (four) times daily as needed for cough.   levETIRAcetam 500 MG tablet Commonly known as: KEPPRA Take 1 tablet (500 mg total) by mouth 2 (two) times daily.   metFORMIN 1000 MG tablet Commonly known as: GLUCOPHAGE Take 1,000 mg by mouth  2 (two) times daily with a meal.   mupirocin ointment 2 % Commonly known as: Bactroban Apply 1 application topically 2 (two) times daily.   olmesartan 20 MG tablet Commonly known as: BENICAR Take 20 mg by mouth daily.   olmesartan-hydrochlorothiazide 20-12.5 MG tablet Commonly known as: Benicar HCT Take 1 tablet by mouth daily.   OneTouch Ultra test strip Generic drug: glucose blood   oxybutynin 10 MG 24 hr tablet Commonly known as: DITROPAN-XL TAKE 1 TABLET BY MOUTH EVERY DAY   rosuvastatin 5 MG tablet Commonly known as: CRESTOR Take 1 tablet (5 mg total) by mouth daily. What changed: how much to take   tamsulosin 0.4 MG Caps capsule Commonly known as: FLOMAX Take 1 capsule (0.4 mg total) by mouth daily.        Allergies:  No Known Allergies  Family History: Family History  Problem Relation Age of Onset   Diabetes Father    Cancer Maternal Uncle     Social History:   reports that he has never smoked. He has never used smokeless tobacco. He reports current alcohol use. He reports that he does not use drugs.  Physical Exam: BP (!) 150/94   Pulse 96   Ht '5\' 9"'$  (1.753 m)   Wt 210 lb (95.3 kg)   BMI 31.01 kg/m   Constitutional:  Alert and oriented, no acute distress, nontoxic appearing HEENT: [place],  AT Cardiovascular: No clubbing, cyanosis, or edema Respiratory: Normal respiratory effort, no increased work of breathing Skin: No rashes, bruises or suspicious lesions Neurologic: Grossly intact, no focal deficits, moving all 4 extremities Psychiatric: Normal mood and affect  Laboratory Data: Results for orders placed or performed in visit on 11/05/20  Bladder Scan (Post Void Residual) in office  Result Value Ref Range   Scan Result 136 mL   Assessment & Plan:   1. Nocturia Nephric and improvement in nocturia as well as daytime frequency on combination Flomax and oxybutynin.  PVR stable.  We will plan to continue these and see him next year for annual  follow-up. - Bladder Scan (Post Void Residual) in office - oxybutynin (DITROPAN-XL) 10 MG 24 hr tablet; Take 1 tablet (10 mg total) by mouth daily.  Dispense: 30 tablet; Refill: 11   Return in about 1 year (around 11/05/2021) for Annual symptom recheck with IPSS, DRE, and PVR.  Debroah Loop, PA-C  [facility] [address], [place], [place] [address] [phone]

## 2020-12-12 ENCOUNTER — Telehealth: Payer: Self-pay

## 2020-12-12 NOTE — Telephone Encounter (Signed)
Called pt to ask about colonoscopy- he stated he is seeing K. Sweetapple "A friend of the family." I removed Dr Ronnald Ramp from his PCP list

## 2021-01-19 ENCOUNTER — Other Ambulatory Visit: Payer: Self-pay | Admitting: Physician Assistant

## 2021-01-19 DIAGNOSIS — R351 Nocturia: Secondary | ICD-10-CM

## 2021-01-19 MED ORDER — TAMSULOSIN HCL 0.4 MG PO CAPS: 0.4000 mg | ORAL_CAPSULE | Freq: Every day | ORAL | 11 refills | Status: AC

## 2021-04-03 ENCOUNTER — Other Ambulatory Visit: Payer: Self-pay | Admitting: Physician Assistant

## 2021-04-03 DIAGNOSIS — G459 Transient cerebral ischemic attack, unspecified: Secondary | ICD-10-CM

## 2021-04-03 DIAGNOSIS — R200 Anesthesia of skin: Secondary | ICD-10-CM

## 2021-04-03 DIAGNOSIS — R569 Unspecified convulsions: Secondary | ICD-10-CM

## 2021-04-13 ENCOUNTER — Ambulatory Visit: Payer: BC Managed Care – PPO

## 2021-05-11 IMAGING — CT CT RENAL STONE PROTOCOL
2 of 4 series · 16 of 46 positions shown, 18 images · non-contrast
Comparison: 11/19/2009

CLINICAL DATA: Left flank pain for 2 weeks.  Nephrolithiasis.

EXAM:
CT ABDOMEN AND PELVIS WITHOUT CONTRAST
TECHNIQUE: Multidetector CT imaging of the abdomen and pelvis was performed
following the standard protocol without IV contrast.

[Series 2: stone full standard · axial · 0.91mm/px · z∈[-709,-209]mm · 13 of 110 slices shown, 15 images]
[im 5/110  soft-tissue]
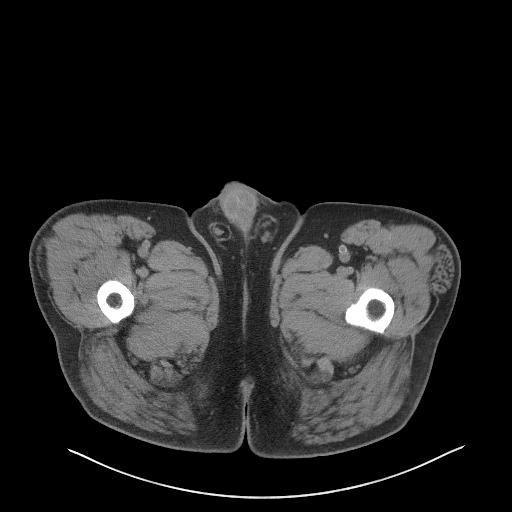
[im 5/110  bone]
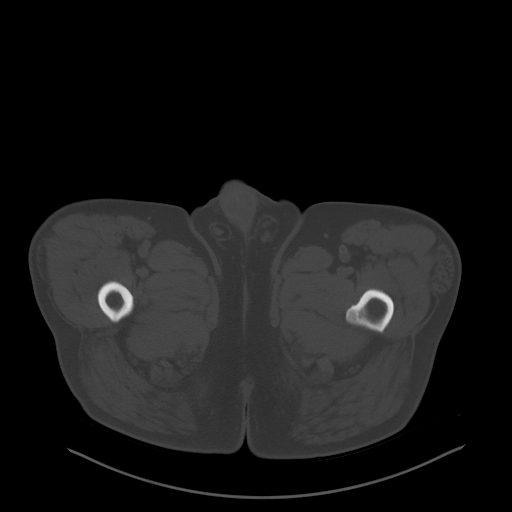
[im 14/110  soft-tissue]
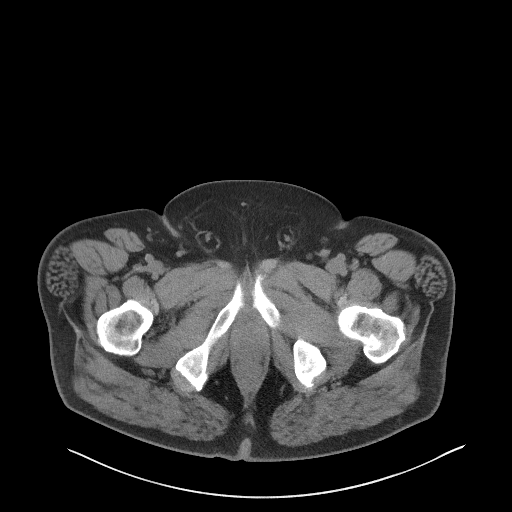
[im 23/110  soft-tissue]
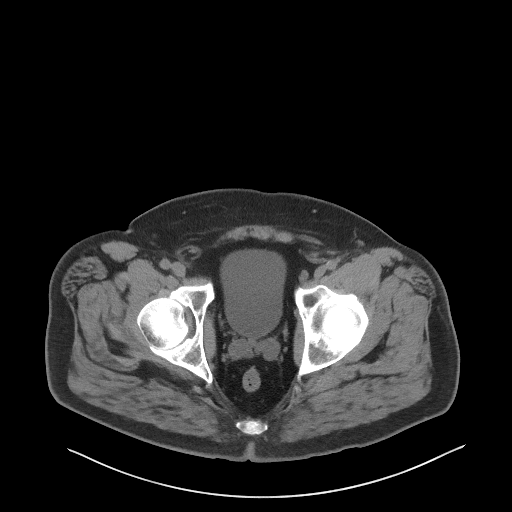
[im 32/110  soft-tissue]
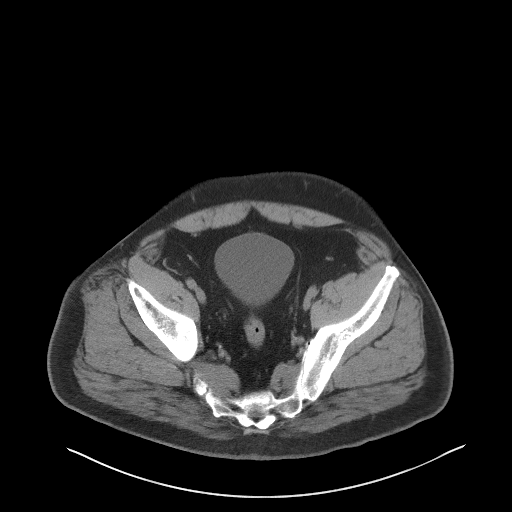
[im 37/110  soft-tissue]
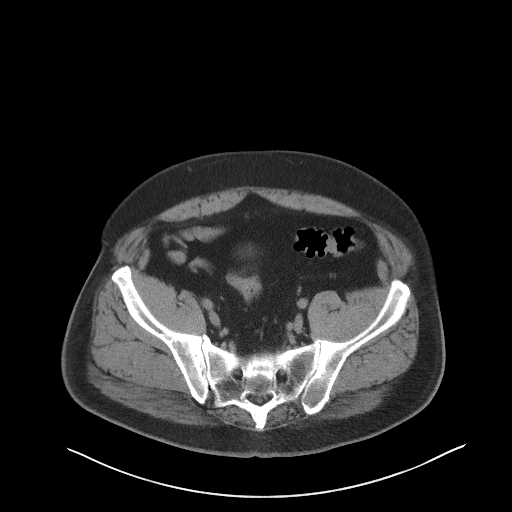
[im 46/110  soft-tissue]
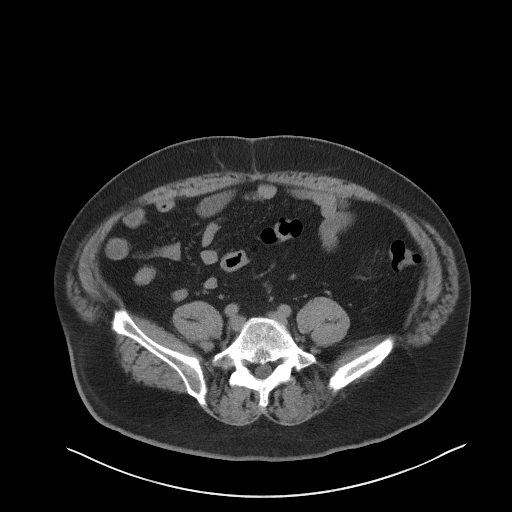
[im 55/110  soft-tissue]
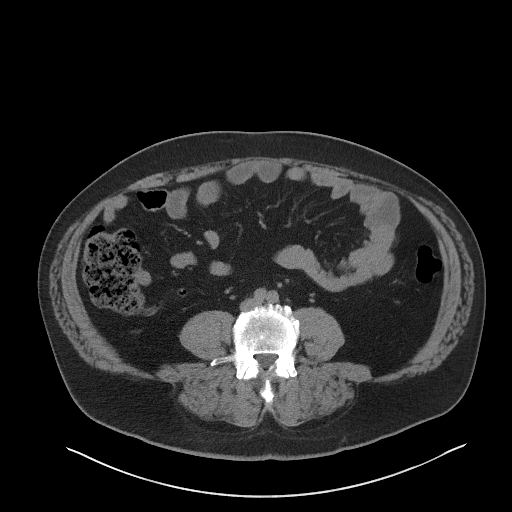
[im 64/110  soft-tissue]
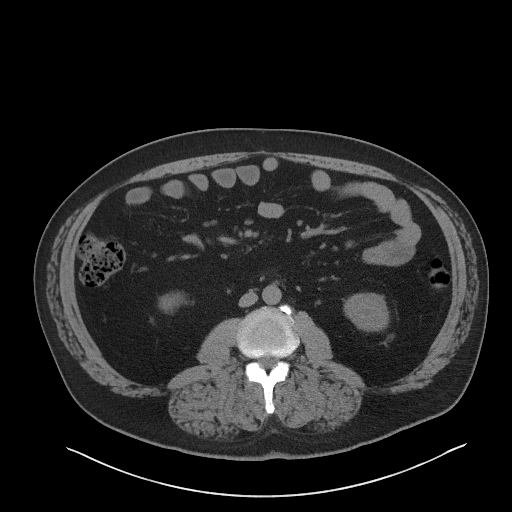
[im 73/110  soft-tissue]
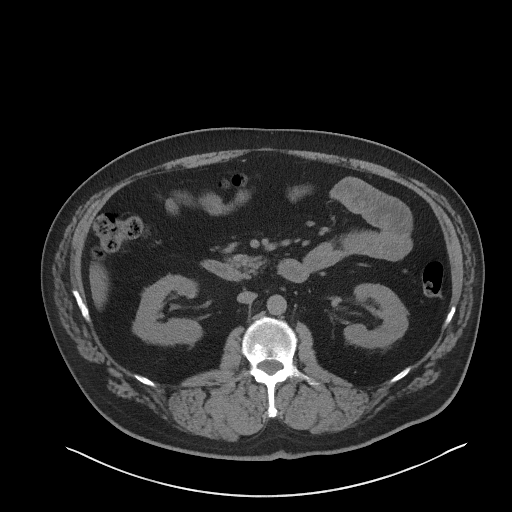
[im 73/110  bone]
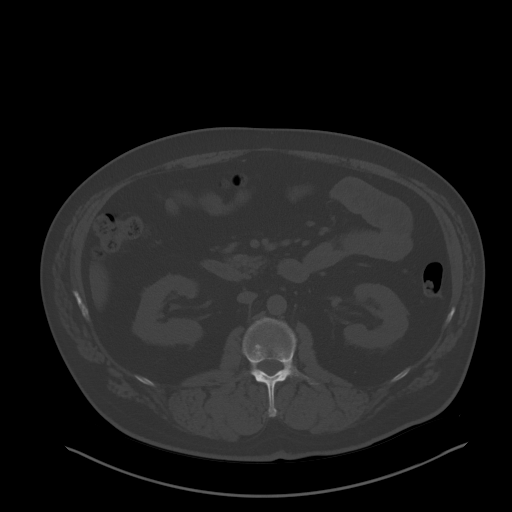
[im 78/110  soft-tissue]
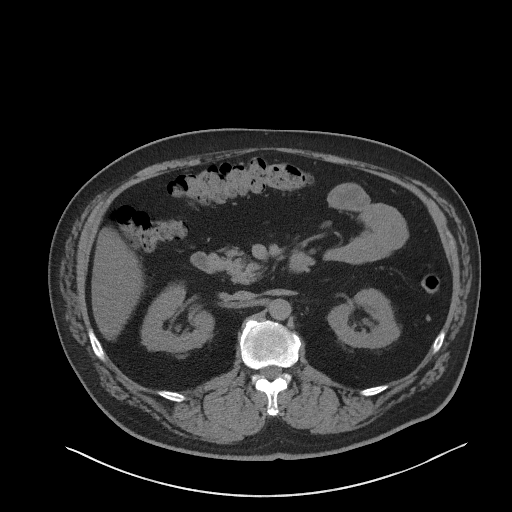
[im 87/110  soft-tissue]
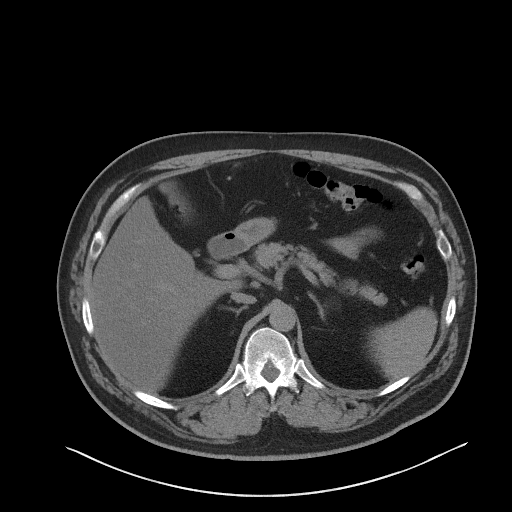
[im 96/110  soft-tissue]
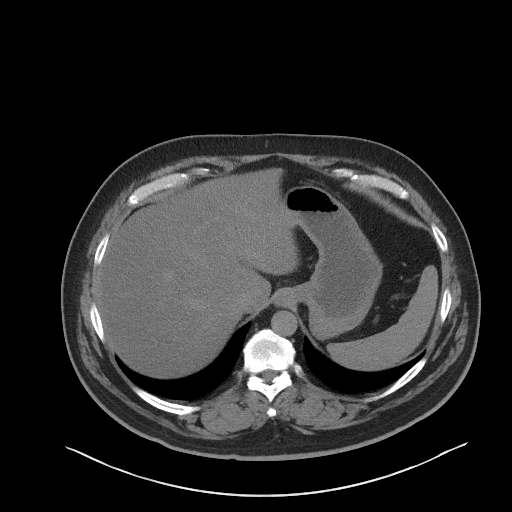
[im 105/110  soft-tissue]
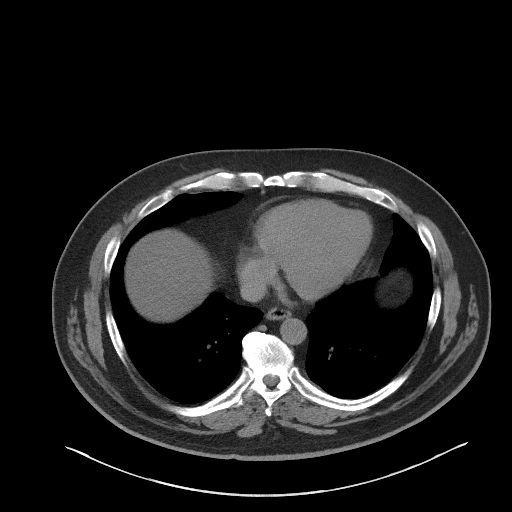

[Series 6: coronal · coronal · 0.82mm/px · 3 of 153 slices shown]
[im 51/153  soft-tissue]
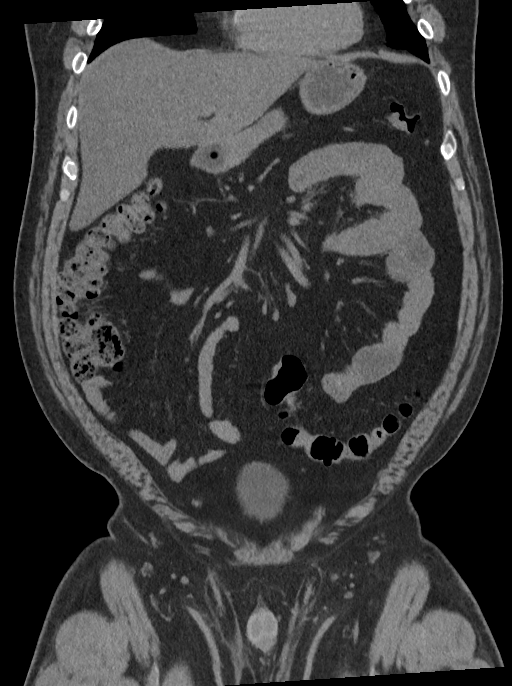
[im 68/153  soft-tissue]
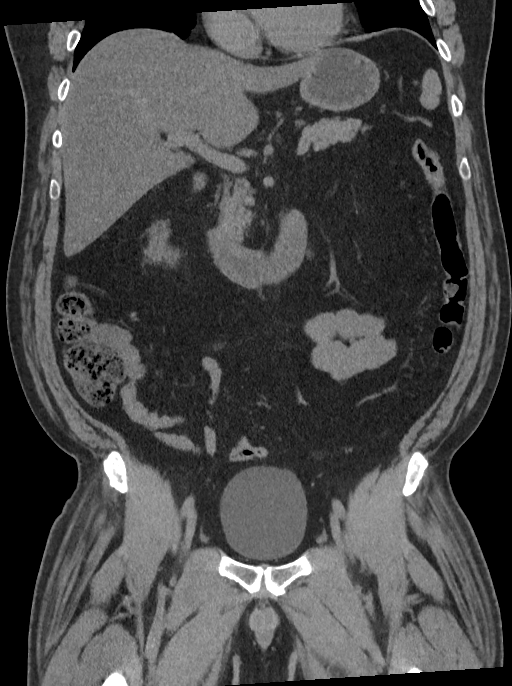
[im 85/153  soft-tissue]
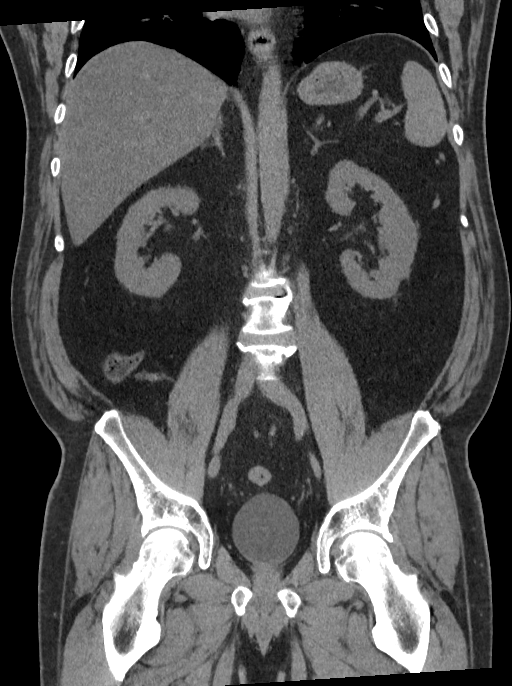

[16 of 46 positions shown; findings below may reference images not displayed]

FINDINGS: Lower chest: No acute findings.

Hepatobiliary: No mass visualized on this unenhanced exam.
Mild-to-moderate diffuse hepatic steatosis noted. Prior
cholecystectomy. No evidence of biliary obstruction.

Pancreas: No mass or inflammatory process visualized on this
unenhanced exam.

Spleen:  Within normal limits in size.

Adrenals/Urinary tract: No evidence of urolithiasis or
hydronephrosis. Unremarkable unopacified urinary bladder.

Stomach/Bowel: No evidence of obstruction, inflammatory process, or
abnormal fluid collections. Normal appendix visualized.

Vascular/Lymphatic: No pathologically enlarged lymph nodes
identified. No evidence of abdominal aortic aneurysm.

Reproductive:  No mass or other significant abnormality.

Other:  None.

Musculoskeletal:  No suspicious bone lesions identified.
IMPRESSION: No evidence of urolithiasis, hydronephrosis, or other acute
findings.

Hepatic steatosis.

## 2021-06-23 ENCOUNTER — Other Ambulatory Visit: Payer: Self-pay | Admitting: Physician Assistant

## 2021-06-23 DIAGNOSIS — M545 Low back pain, unspecified: Secondary | ICD-10-CM

## 2021-06-24 ENCOUNTER — Ambulatory Visit
Admission: RE | Admit: 2021-06-24 | Discharge: 2021-06-24 | Disposition: A | Discharge status: Home / Self Care | Payer: BC Managed Care – PPO | Source: Ambulatory Visit | Attending: Physician Assistant | Admitting: Physician Assistant

## 2021-06-24 DIAGNOSIS — M545 Low back pain, unspecified: Secondary | ICD-10-CM | POA: Insufficient documentation

## 2021-11-10 ENCOUNTER — Ambulatory Visit: Payer: Self-pay | Admitting: Urology

## 2022-07-07 IMAGING — CR DG LUMBAR SPINE 2-3V
3 series · 3 of 3 positions shown · non-contrast
Comparison: X-ray 04/18/2008; MRI 10/29/2008.

CLINICAL DATA: Acute left-sided low back pain x 4 weeks.

EXAM:
LUMBAR SPINE - 2-3 VIEW

[l-spine ap]
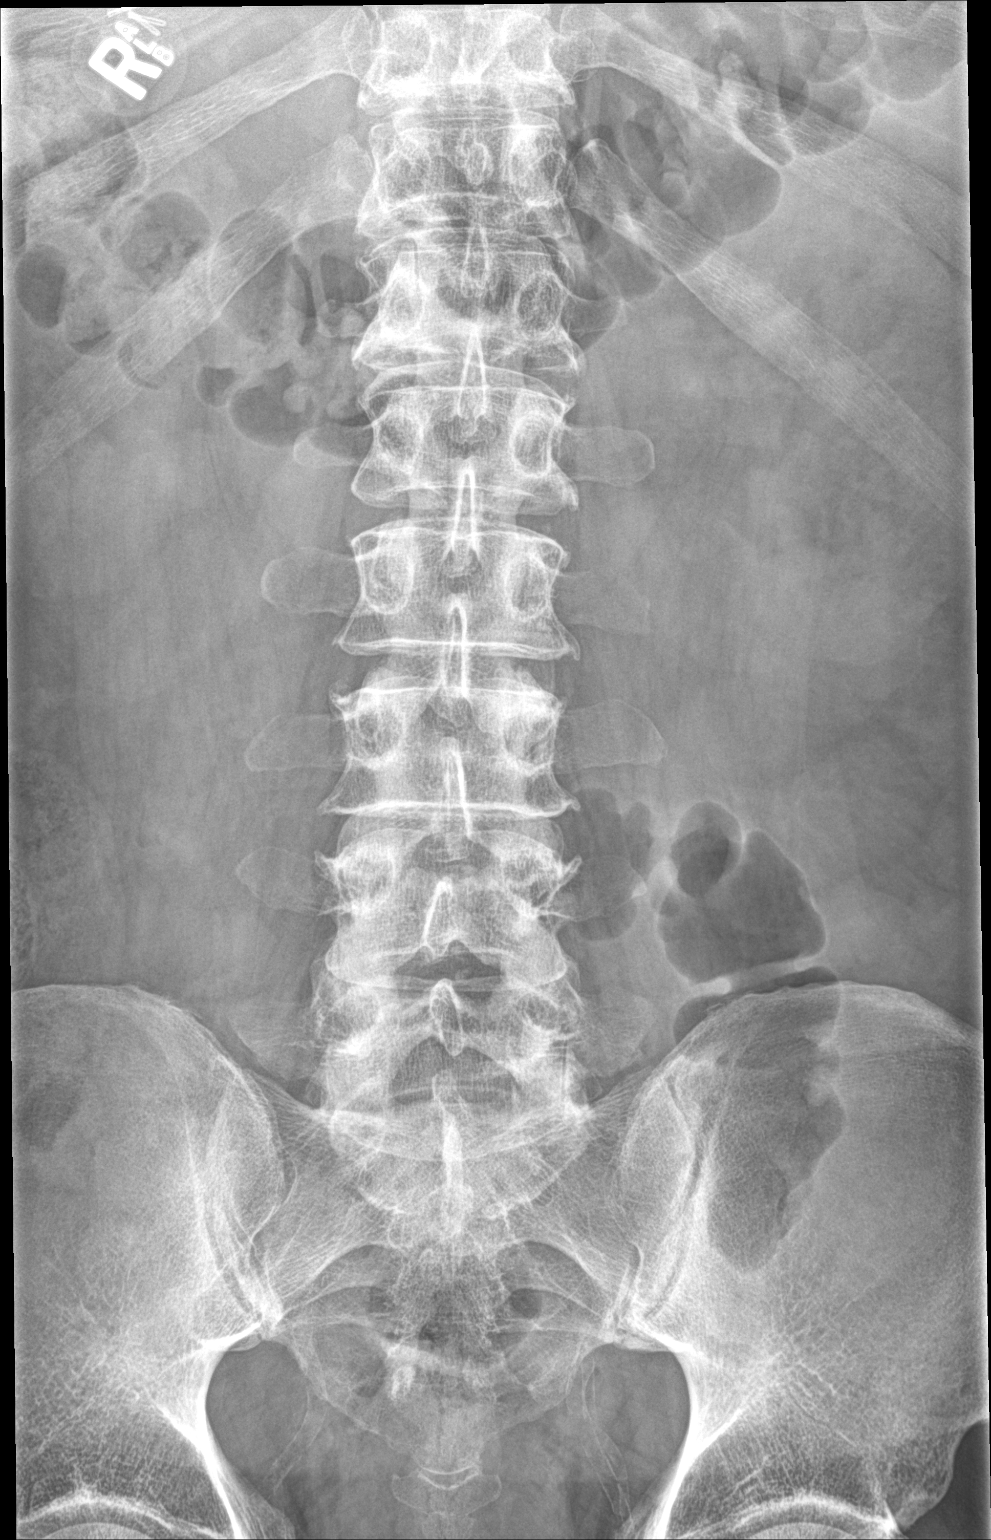

[l-spine lat]
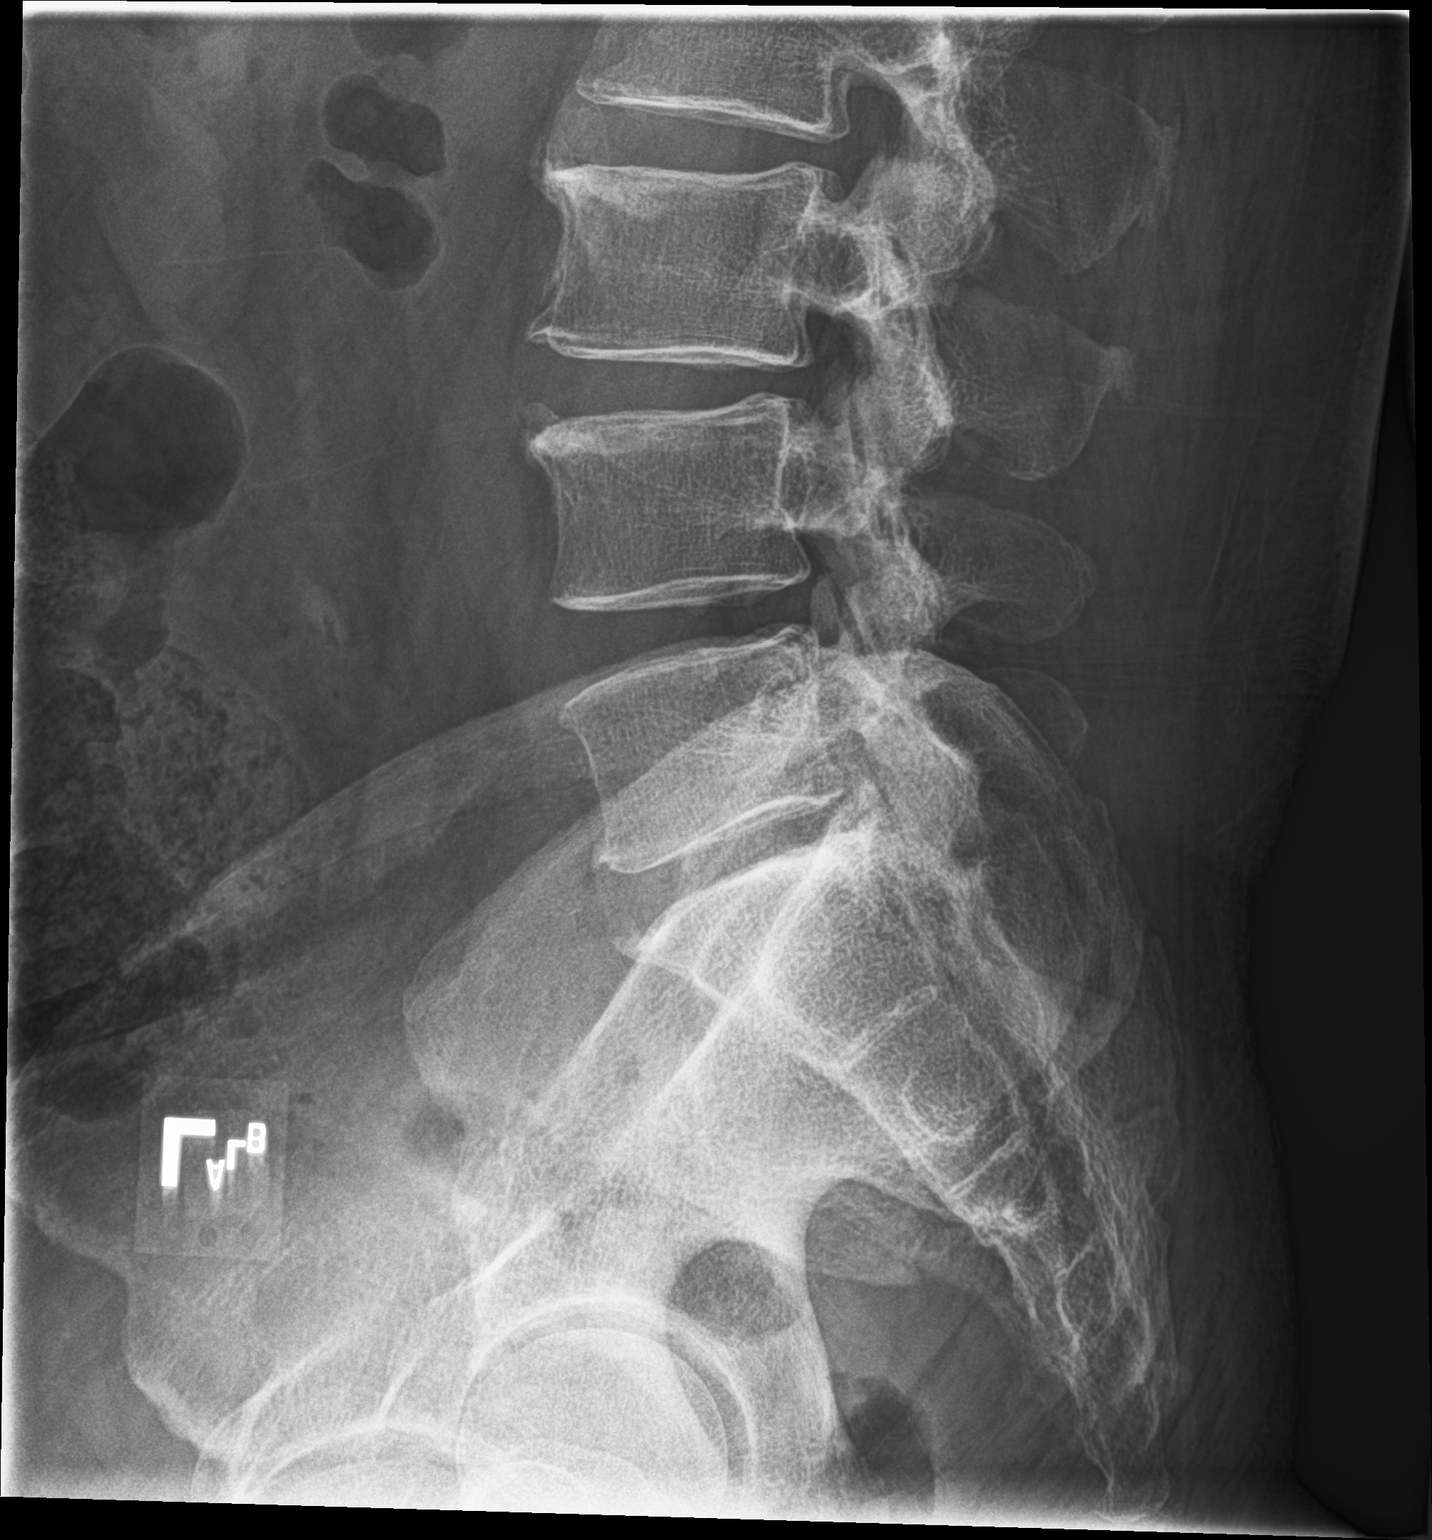

[l-spine spot]
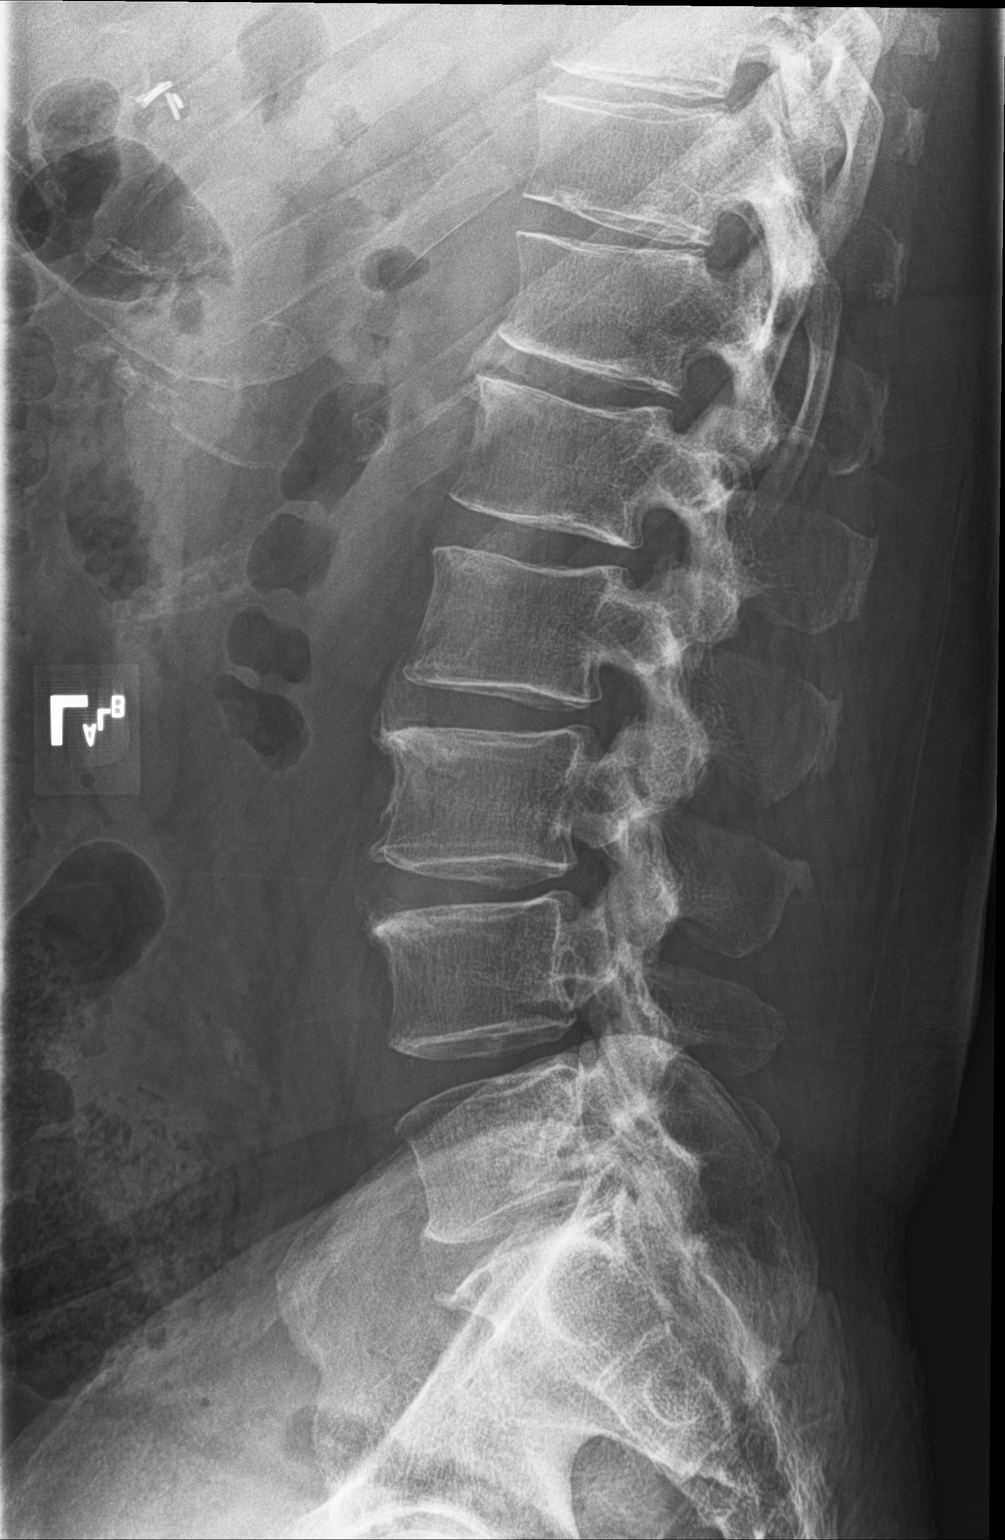

[3 of 3 positions shown; findings below may reference images not displayed]

FINDINGS: Six lumbar type vertebrae. The lowest disc space is numbered L5-S1
based on MRI numbering scheme from 7757. No evidence of fracture or
bone lesion. Generalized disc space narrowing and spondylitic
spurring. Negative facets.
IMPRESSION: 1. No acute or focal finding.
2. Generalized spondylitic spurring and mild disc space narrowing.
3. Six lumbar type vertebrae, the lowest numbered L5 based on 7757
MRI report.
